# Patient Record
Sex: Female | Born: 1970 | Race: White | Hispanic: No | Marital: Single | Smoking: Former smoker
Health system: Southern US, Community
[De-identification: ages and names within clinical notes are randomized; demographics above are authoritative.]

## PROBLEM LIST (undated history)

## (undated) DIAGNOSIS — C50919 Malignant neoplasm of unspecified site of unspecified female breast: Secondary | ICD-10-CM

## (undated) DIAGNOSIS — T7840XA Allergy, unspecified, initial encounter: Secondary | ICD-10-CM

## (undated) DIAGNOSIS — E785 Hyperlipidemia, unspecified: Secondary | ICD-10-CM

## (undated) DIAGNOSIS — M8580 Other specified disorders of bone density and structure, unspecified site: Secondary | ICD-10-CM

## (undated) HISTORY — DX: Other specified disorders of bone density and structure, unspecified site: M85.80

## (undated) HISTORY — DX: Hyperlipidemia, unspecified: E78.5

## (undated) HISTORY — DX: Allergy, unspecified, initial encounter: T78.40XA

## (undated) HISTORY — PX: TUBAL LIGATION: SHX77

## (undated) HISTORY — DX: Malignant neoplasm of unspecified site of unspecified female breast: C50.919

---

## 2000-01-13 ENCOUNTER — Emergency Department (HOSPITAL_COMMUNITY): Admission: EM | Admit: 2000-01-13 | Discharge: 2000-01-13 | Payer: Self-pay | Admitting: *Deleted

## 2000-01-14 ENCOUNTER — Emergency Department (HOSPITAL_COMMUNITY): Admission: EM | Admit: 2000-01-14 | Discharge: 2000-01-14 | Payer: Self-pay | Admitting: Emergency Medicine

## 2000-01-15 ENCOUNTER — Inpatient Hospital Stay (HOSPITAL_COMMUNITY): Admission: EM | Admit: 2000-01-15 | Discharge: 2000-01-19 | Payer: Self-pay | Admitting: Emergency Medicine

## 2009-05-03 ENCOUNTER — Ambulatory Visit (HOSPITAL_COMMUNITY): Admission: RE | Admit: 2009-05-03 | Discharge: 2009-05-03 | Payer: Self-pay | Admitting: Family Medicine

## 2010-11-06 NOTE — Discharge Summary (Signed)
[facility]  Patient:    Kim Green                       MRN: [id number] Adm. Date:  [id number] Disc. Date: [id number] Attending:  Miguel Aschoff CC:         [facility]                           Discharge Summary  DATE OF BIRTH:  January 04, 1971.  CONSULTANTS:  None.  PROCEDURES:  None.  DISCHARGE DIAGNOSES: 1. Escherichia coli pyelonephritis with bacteremia, septicemia. 2. Intractable nausea, vomiting and dehydration secondary to #1. 3. Tobacco abuse, one pack per day x 10 years. 4. Microcytic anemia presumed secondary to menstrual loss, asymptomatic. 5. Probable depression, new diagnosis. 6. Bacterial vaginosis, diagnosed in the emergency room prior to this    admission.  DISCHARGE MEDICATIONS: 1. Septra DS one p.o. b.i.d. x 10 days. 2. Flagyl 500 mg p.o. b.i.d. x 7 days, as previously prescribed through    emergency room.  DISCHARGE FOLLOWUP:  Patient is scheduled for eligibility appointment at the [facility], January 27, 2000, at 11 a.m.  HISTORY:  Patient is a 40 year old woman who had experienced five days of dysuria, increased urinary frequency, followed by fevers, chills, nausea, vomiting and right flank pain.  She was seen in the emergency room, July 25th, with a fever of 102.  At that time, her pelvic exam was benign and GC and Chlamydia cultures were negative.  Urine pregnancy test was also noted to be negative.  Her urinalysis was significant for gross pyuria.  She was discharged after receiving a prescription for Septra, which she did not fill, and she returned the following day with increased nausea, vomiting and dehydration.  Medication to complete her course was provided by the emergency department but she was unable to keep it down and returned on date of admission very ill.  This represents her first episode of pyelonephritis and cystitis.  PHYSICAL EXAMINATION:  Temperature was 102.7, heart rate  160, respirations 32, blood pressure 106/62.  She had very dry mucous membranes and extremely poor skin turgor.  Right flank tenderness was noted on palpation.  LABORATORY DATA:  Urine culture obtained 24 hours previously revealed E. coli with sensitivity pending.  Hemoglobin was 11.2, MCV 75, WBC 15.7, platelets 177,000.  Sodium 131, potassium 3, chloride 101, CO2 23, BUN 12, creatinine 0.1, glucose 114.  LFTs were normal.  Kim Green was admitted with pyelonephritis and suspicion of bacteremia, for further care.  HOSPITAL COURSE: #1 - Kim Green was placed on IV Tequin, Compazine, Phenergan and Zofran (of which Zofran was the most effective), analgesia and IV fluids.  Within 24 hours, she was beginning to improve somewhat, although continued to have fevers.  On her second hospital day, she was defervescing and able to tolerate clear liquids and a subsequent advancement of her diet.  Her symptoms gradually resolved.  Two-of-two blood cultures were positive for E. coli, pansensitive.  Tequin was changed to oral, and she is discharged to complete a previous course of Septra for 14 total days of therapy.  We discussed means of preventing cystitis.  She is sexually active, although denies unprotected sex.  At the time of discharge, she is quite stable, ambulating without dizziness and consuming a normal diet.  Labs had normalized except for persistence of microcytic anemia noted in above labs.  #2 - DEPRESSION:  On several occasions, Kim Green was noted to be emotionally unstable, although she denied suicidal or homicidal ideations. She explained that she is under considerable stress and is depressed and she has never been evaluated for this.  I have advised her to notify her [facility] doctor who may refer her to county mental health, if appropriate. DD:  01/19/00 TD:  01/20/00 Job: [id number]

## 2010-11-06 NOTE — H&P (Signed)
[facility]  Patient:    Kim Green, Kim Green                      MRN: [id number] Adm. Date:  [id number] Attending:  Miguel Aschoff                         History and Physical  DATE OF BIRTH:  10/28/1970  PROBLEM LIST: 1. E. coli pyelonephritis with septicemia, rule out bacteremia. 2. Intractable nausea and vomiting associated with right flank pain secondary    to problem #1. 3. Severe dehydration. 4. Hypokalemia. 5. Smoking, one pack per day x 10 years. 6. Microcytic anemia. 7. Allergy to ROCEPHIN (rash).  CHIEF COMPLAINT:  Fever, chills, nausea, vomiting, right flank pain.  HISTORY OF PRESENT ILLNESS:  The patient is a 40 year old female who presents with a five-day history of dysuria, increased urinary frequency followed a few days later by fever, chills, nausea, vomiting, and right flank pain. The patient was seen on January 13, 2000, with a fever of 102. Her pelvic exam was benign. The GC and Chlamydia probes were negative. The urine pregnancy test was negative. The urinalysis was significant for gross pyuria. The patient was discharged home after receiving a prescription of Septra. The patient did not refill this prescription, and she returned the following day, January 14, 2000, to the emergency department at [facility]. During this second visit, the patient has increased signs of nausea and vomiting and dehydration. Septra was provided by the [facility] Emergency Department. The patient returned today again to the emergency department due to intractable nausea and vomiting, fever, chills, malaise.  The patient denies any previous history of pyelonephritis or UTIs. She denies unprotected sex. She denies IV drug abuse. She denies any previous history of nephrolithiasis. No melena, tarry stools, bright red blood per rectum, vaginal discharge. No diarrhea. As described above, the patient was unable to keep down solid or liquids for the last  two days. She denies jaw swelling, skin rash.  PAST MEDICAL HISTORY:  As problem list.  ALLERGIES:  As problem list.  CURRENT MEDICATIONS: 1. Septra DS one tablet b.i.d. (The patient did not take any of these pills    due to intractable vomiting.) 2. Compazine suppositories ______  mg q.6h. p.r.n.  SOCIAL HISTORY:  The patient is single. No children. Currently is unemployed. 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 She smokes as described in problem list. She does not drink. She does not use illegal drugs.  FAMILY MEDICAL HISTORY:  Negative for diabetes, hypertension, strokes, CAD, malignancy.  REVIEW OF SYSTEMS:  As HPI. No abdominal complaints. No rhinorrhea. No postnasal drip. No presyncopal symptoms. No seizures. No vertigo. No double vision. No focal weakness. No swallowing problems.  PHYSICAL EXAMINATION:  VITAL SIGNS:  Temperature 102.7, heart rate 160, respirations 32, blood pressure 106/62.  HEENT:  Normocephalic, atraumatic. Nonicteric sclerae. Conjunctivae within normal limits, except slightly pale. PERRLA. EOMI. Funduscopic exam negative for papilledema or hemorrhages. Very dry mucous membranes. Oropharynx clear. TMs within normal limits. The patient has a piercing in her tongue.  NECK:  Supple. No JVD. No bruits. No adenopathy. No thyromegaly.  LUNGS:  Clear to auscultation bilaterally without crackles, wheezes. Good air movement bilaterally.  CARDIAC:  Tachycardic. No S3. No rubs. Questionable 1/6 systolic ejection murmur at the left lower sternal border.  ABDOMEN:  Nontender, nondistended. Bowel sounds were present. There is a right flank  tenderness to palpation. No hepatosplenomegaly. No bruits. No masses. No rebound. No guarding.  RECTAL:  Deferred.  GENITOURINARY:  Within normal limits.  PELVIC:  Within normal limits.  EXTREMITIES:  No clubbing, cyanosis, or edema. Pulses 2+ bilaterally.  NEUROLOGICAL:  Alert and oriented x 3. Strength 5/5 in all  extremities. DTRs 3/5 in all extremities. Cranial nerves 2 through 12 intact. Sensory intact. Plantar reflexes downgoing bilaterally.  LABORATORY DATA:  Urine culture obtained 24 hours ago consistent with E. coli; sensitivity is pending. Hemoglobin 11.2, MCV 75, WBCs 15.7, platelets 177. Sodium 131, potassium 3, chloride 101, CO2 23, BUN 12, creatinine 1, glucose 114, albumin 3.3. LFTs within normal limits.  ASSESSMENT AND PLAN: 1. E. coli pyelonephritis with septicemia, rule out bacteremia. The patient    presents with clear signs of pyelonephritis. The urine culture is already    isolating E. coli. The sensitivities are pending. Given the patients    tachycardia, fever of 102.7, borderline hypotension, systemic signs of    infection, septicemia is a definitive diagnosis. Bacteremia needs to be    ruled out given the ongoing infectious process for about five days without    therapy. Blood cultures were obtained in the emergency department. The    patient is being admitted to the hospital to continue with intravenous    antibiotic therapy, pain control, and supportive care. We will be following    closely the sensitivities of the urine culture and the blood cultures that    currently are pending. A new CBC will be obtained in the morning to follow    on the patients leukocytosis. 2. Intractable nausea and vomiting. The symptoms seem to be related to problem    #1. For now, we will use supportive care with IV fluids, IV Pepcid, and IV    Phenergan. The main treatment plan also will be focused on the resolution    of the patients pyelonephritis which will help with these other    gastrointestinal symptoms. 3. Dehydration/hypokalemia. The physical exam is consistent with moderate to    severe degree of dehydration. Also noted is the hypokalemia associated with    this dehydration and decreased p.o. intake in the last few days. We will    start IV fluids wide open here in the emergency  department. The fluid    balance will be monitored. For now, we will use intravenous potassium     supplementation given the patient is having nausea and vomiting. Also, we    will follow the orthostatic blood pressure and daily weights throughout    this hospital stay. 4. Smoking. We spent about 10 minutes talking about smoking cessation. We will    go ahead and start a nicotine patch to help with withdrawal symptoms. 5. Microcytic anemia. The patient describes no symptoms of bleeding. I suspect    iron-deficiency anemia secondary to menstrual periods and acute disease.    This anemia will be worked up as an outpatient if no evidence of active    gastrointestinal bleed throughout this hospital stay. The patients    hemoglobin will be repeated tomorrow morning after hydration. For now, we    will use intravenous Pepcid for gastrointestinal protection. DD:  01/15/00 TD:  01/18/00 Job: [id number]

## 2011-12-22 ENCOUNTER — Ambulatory Visit (INDEPENDENT_AMBULATORY_CARE_PROVIDER_SITE_OTHER): Payer: BC Managed Care – PPO | Admitting: Family Medicine

## 2011-12-22 VITALS — BP 118/82 | HR 88 | Temp 98.0°F | Resp 18 | Ht 67.0 in | Wt 174.6 lb

## 2011-12-22 DIAGNOSIS — R51 Headache: Secondary | ICD-10-CM

## 2011-12-22 DIAGNOSIS — M542 Cervicalgia: Secondary | ICD-10-CM

## 2011-12-22 DIAGNOSIS — J301 Allergic rhinitis due to pollen: Secondary | ICD-10-CM

## 2011-12-22 DIAGNOSIS — J329 Chronic sinusitis, unspecified: Secondary | ICD-10-CM

## 2011-12-22 MED ORDER — CEFDINIR 300 MG PO CAPS: 300.0000 mg | ORAL_CAPSULE | Freq: Two times a day (BID) | ORAL | Status: AC

## 2011-12-22 NOTE — Patient Instructions (Signed)
Can take allergy med - allegra or claritin - 1 per day.  Ibuprofen or alleve as needed for the headache and neck pain.  If not improving in 4-5 days - recheck.  Sooner if worse.

## 2011-12-22 NOTE — Progress Notes (Signed)
  Subjective:    Patient ID: Kim Green, female    DOB: 1970-07-09, 41 y.o.   MRN: [id number]  HPI Kim Green is a 41 y.o. female Ha, sore in back of neck at times - on and off past 10 days.  Sinuses congested and pressure for past 10 days. No known fever.  Blowing nose more past 2 days - clear.  No photophobia, no phonophobia.  Dizziness - with certain head movements when in bed or bending forward.  Worse headache with leaning forward.   Hx of allergies in past but usually not this bad. No recent allergy meds.  Tx: dayquil x 1 last night.   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 , quit smoking in 2011.    Review of Systems Per hpi.     Objective:   Physical Exam  Constitutional: She is oriented to person, place, and time. She appears well-developed and well-nourished. No distress.  HENT:  Head: Normocephalic and atraumatic.  Right Ear: Hearing, tympanic membrane, external ear and ear canal normal.  Left Ear: Hearing, tympanic membrane, external ear and ear canal normal.  Nose: Nose normal. No mucosal edema. Right sinus exhibits no maxillary sinus tenderness and no frontal sinus tenderness. Left sinus exhibits no maxillary sinus tenderness and no frontal sinus tenderness.  Mouth/Throat: Oropharynx is clear and moist. No oropharyngeal exudate.  Eyes: Conjunctivae and EOM are normal. Pupils are equal, round, and reactive to light.  Neck: Normal range of motion. Neck supple.  Cardiovascular: Normal rate, regular rhythm, normal heart sounds and intact distal pulses.   No murmur heard. Pulmonary/Chest: Effort normal and breath sounds normal. No respiratory distress. She has no wheezes. She has no rhonchi.  Musculoskeletal: Normal range of motion.  Lymphadenopathy:    She has no cervical adenopathy.  Neurological: She is alert and oriented to person, place, and time.  Skin: Skin is warm and dry. No rash noted.  Psychiatric: She has a normal mood and affect. Her behavior is  normal.       Assessment & Plan:  Kim Green is a 41 y.o. female 1. Headache   2. Sinusitis   3. Neck pain    Likely combo of tension HA, allergies, and sinusitis - ddx sphenoid as nt to percussion over frontal/maxillary sinuses. discusssed sinus xray series - patient declined today, but recommended this or ct sinuses in next 5 days if not improving with alleve or ibuprofen, omnicef 300mg  BID, allegra or claritin and HEP/tx per neck manual. Understanding expressed.  Patient Instructions  Can take allergy med - allegra or claritin - 1 per day.  Ibuprofen or alleve as needed for the headache and neck pain.  If not improving in 4-5 days - recheck.  Sooner if worse.      Trial of neck care manual HEP,

## 2012-03-29 ENCOUNTER — Ambulatory Visit (INDEPENDENT_AMBULATORY_CARE_PROVIDER_SITE_OTHER): Payer: BC Managed Care – PPO | Admitting: Family Medicine

## 2012-03-29 VITALS — BP 110/76 | HR 94 | Temp 98.0°F | Resp 16 | Ht 65.5 in | Wt 182.0 lb

## 2012-03-29 DIAGNOSIS — N390 Urinary tract infection, site not specified: Secondary | ICD-10-CM

## 2012-03-29 DIAGNOSIS — R350 Frequency of micturition: Secondary | ICD-10-CM

## 2012-03-29 LAB — POCT URINALYSIS DIPSTICK
Bilirubin, UA: NEGATIVE
Glucose, UA: NEGATIVE
Ketones, UA: NEGATIVE
Nitrite, UA: NEGATIVE
Protein, UA: NEGATIVE
Spec Grav, UA: <=1.005
Urobilinogen, UA: 0.2
pH, UA: 6

## 2012-03-29 LAB — POCT UA - MICROSCOPIC ONLY
Casts, Ur, LPF, POC: NEGATIVE
Crystals, Ur, HPF, POC: NEGATIVE
Mucus, UA: POSITIVE
Yeast, UA: NEGATIVE

## 2012-03-29 MED ORDER — CEPHALEXIN 500 MG PO CAPS: 500.0000 mg | ORAL_CAPSULE | Freq: Two times a day (BID) | ORAL | Status: DC

## 2012-03-29 NOTE — Progress Notes (Signed)
[facility] [address] [place] [address] [phone]  Date:  03/29/2012   Name:  Kim Green   DOB:  1970/11/23   MRN:  [id number]  PCP:  No primary provider on file.    Chief Complaint: Urinary Tract Infection   History of Present Illness:  Kim Green is a 41 y.o. very pleasant female patient who presents with the following:  She is concerned that she may have a UTI.  She has noted urinary symptoms that have waxed and waned for the last few weeks.  She has tried to resolve the problem with hydration and cranberry juice, but she thinks she now needs some medicine.  She has also noted some back pain on and off.  No fever, no vomiting.   No vaginal symptoms.  No abdominal pain LMP was last week  She also notes a cold for the last 3 days- feels achy and tired.  She had some sneezing, and has PND.  Not coughing.  Chest is congested.  Otherwise she is generally healthy and takes no rx medications  There is no problem list on file for this patient.   No past medical history on file.  No past surgical history on file.  History  Substance Use Topics  . Smoking status: Former Games developer  . Smokeless tobacco: Not on file  . Alcohol Use: Not on file    No family history on file.  Allergies  Allergen Reactions  . Avelox (Moxifloxacin Hcl In Nacl)     Medication list has been reviewed and updated.  No current outpatient prescriptions on file prior to visit.    Review of Systems:  As per HPI- otherwise negative.   Physical Examination: Filed Vitals:   03/29/12 1530  BP: 110/76  Pulse: 94  Temp: 98 F (36.7 C)  Resp: 16   Filed Vitals:   03/29/12 1530  Height: 5' 5.5" (1.664 m)  Weight: 182 lb (82.555 kg)   Body mass index is 29.83 kg/(m^2). Ideal Body Weight: Weight in (lb) to have BMI = 25: 152.2   GEN: WDWN, NAD, Non-toxic, A & O x 3, overweight, looks well HEENT: Atraumatic, Normocephalic. Neck supple. No masses, No LAD.   Bilateral TM wnl, oropharynx normal.  PEERL,EOMI.   Ears and Nose: No external deformity. CV: RRR, No M/G/R. No JVD. No thrill. No extra heart sounds. PULM: CTA B, no wheezes, crackles, rhonchi. No retractions. No resp. distress. No accessory muscle use. ABD: S, NT, ND, +BS. No rebound. No HSM.  No CVA tenderness  EXTR: No c/c/e NEURO Normal gait.  PSYCH: Normally interactive. Conversant. Not depressed or anxious appearing.  Calm demeanor.   Results for orders placed in visit on 03/29/12  POCT UA - MICROSCOPIC ONLY      Component Value Range   WBC, Ur, HPF, POC 3-7     RBC, urine, microscopic 1-3     Bacteria, U Microscopic trace     Mucus, UA positive     Epithelial cells, urine per micros 0-1     Crystals, Ur, HPF, POC neg     Casts, Ur, LPF, POC neg     Yeast, UA neg    POCT URINALYSIS DIPSTICK      Component Value Range   Color, UA yellow     Clarity, UA clear     Glucose, UA neg     Bilirubin, UA neg     Ketones, UA neg  Spec Grav, UA <=1.005     Blood, UA mod     pH, UA 6.0     Protein, UA neg     Urobilinogen, UA 0.2     Nitrite, UA neg     Leukocytes, UA small (1+)      Assessment and Plan: 1. Urinary frequency  POCT UA - Microscopic Only, POCT urinalysis dipstick  2. UTI (lower urinary tract infection)  Urine culture, cephALEXin (KEFLEX) 500 MG capsule   Her urine is not overly impressive, but is is very dilute. Will go ahead and treat her based on her symptoms while we await her culture.  She has a high rx deductible, so will use keflex which is inexpensive.  Let me know if not better in the next few days- Sooner if worse.     Abbe Amsterdam, MD

## 2012-04-01 LAB — URINE CULTURE: Colony Count: >=100000

## 2012-06-21 DIAGNOSIS — Z0271 Encounter for disability determination: Secondary | ICD-10-CM

## 2012-08-19 DIAGNOSIS — Z0271 Encounter for disability determination: Secondary | ICD-10-CM

## 2012-09-04 ENCOUNTER — Emergency Department (HOSPITAL_COMMUNITY)
Admission: EM | Admit: 2012-09-04 | Discharge: 2012-09-04 | Disposition: A | Discharge status: Home / Self Care | Payer: No Typology Code available for payment source | Attending: Emergency Medicine | Admitting: Emergency Medicine

## 2012-09-04 ENCOUNTER — Encounter (HOSPITAL_COMMUNITY): Payer: Self-pay | Admitting: *Deleted

## 2012-09-04 ENCOUNTER — Emergency Department (HOSPITAL_COMMUNITY): Payer: No Typology Code available for payment source

## 2012-09-04 DIAGNOSIS — S139XXA Sprain of joints and ligaments of unspecified parts of neck, initial encounter: Secondary | ICD-10-CM | POA: Insufficient documentation

## 2012-09-04 DIAGNOSIS — S161XXA Strain of muscle, fascia and tendon at neck level, initial encounter: Secondary | ICD-10-CM

## 2012-09-04 DIAGNOSIS — S59909A Unspecified injury of unspecified elbow, initial encounter: Secondary | ICD-10-CM | POA: Insufficient documentation

## 2012-09-04 DIAGNOSIS — T148XXA Other injury of unspecified body region, initial encounter: Secondary | ICD-10-CM

## 2012-09-04 DIAGNOSIS — Y92410 Unspecified street and highway as the place of occurrence of the external cause: Secondary | ICD-10-CM | POA: Insufficient documentation

## 2012-09-04 DIAGNOSIS — Y9389 Activity, other specified: Secondary | ICD-10-CM | POA: Insufficient documentation

## 2012-09-04 DIAGNOSIS — S6990XA Unspecified injury of unspecified wrist, hand and finger(s), initial encounter: Secondary | ICD-10-CM | POA: Insufficient documentation

## 2012-09-04 DIAGNOSIS — IMO0002 Reserved for concepts with insufficient information to code with codable children: Secondary | ICD-10-CM | POA: Insufficient documentation

## 2012-09-04 DIAGNOSIS — Z87891 Personal history of nicotine dependence: Secondary | ICD-10-CM | POA: Insufficient documentation

## 2012-09-04 MED ORDER — IBUPROFEN 400 MG PO TABS
600.0000 mg | ORAL_TABLET | Freq: Once | ORAL | Status: AC
  Administered 2012-09-04: 600 mg via ORAL
  Filled 2012-09-04: qty 1

## 2012-09-04 MED ORDER — IBUPROFEN 600 MG PO TABS: 600.0000 mg | ORAL_TABLET | Freq: Three times a day (TID) | ORAL | Status: DC | PRN

## 2012-09-04 MED ORDER — HYDROCODONE-ACETAMINOPHEN 5-325 MG PO TABS: 2.0000 | ORAL_TABLET | Freq: Once | ORAL | Status: DC
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
## 2012-09-04 NOTE — ED Notes (Signed)
Dr Darrin Luis at bedside removed C collar
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
## 2012-09-04 NOTE — ED Notes (Signed)
Pt alert and mentating appropriately upon d/c teaching; pt given d/c teaching and prescriptions; pt educated on at home pain management and if/when to come back to ED; pt verbalizes understanding of d/c teaching and prescriptions; pt denies need for further questions; NAD noted upon d/c.

## 2012-09-04 NOTE — ED Notes (Signed)
Dr Darrin Luis notified of pts pulse; Dr Darrin Luis at bedside and verifies pt stable for d/c at this time

## 2012-09-04 NOTE — ED Notes (Signed)
Correction to care hand off; report not given to next shift RN; pt being d/c

## 2012-09-12 ENCOUNTER — Ambulatory Visit (INDEPENDENT_AMBULATORY_CARE_PROVIDER_SITE_OTHER): Payer: BC Managed Care – PPO | Admitting: Physician Assistant

## 2012-09-12 DIAGNOSIS — S161XXD Strain of muscle, fascia and tendon at neck level, subsequent encounter: Secondary | ICD-10-CM

## 2012-09-12 DIAGNOSIS — Z5189 Encounter for other specified aftercare: Secondary | ICD-10-CM

## 2012-09-12 DIAGNOSIS — S8991XD Unspecified injury of right lower leg, subsequent encounter: Secondary | ICD-10-CM

## 2012-09-12 DIAGNOSIS — S8992XD Unspecified injury of left lower leg, subsequent encounter: Secondary | ICD-10-CM

## 2012-09-12 MED ORDER — CYCLOBENZAPRINE HCL 10 MG PO TABS: 10.0000 mg | ORAL_TABLET | Freq: Three times a day (TID) | ORAL | Status: DC | PRN

## 2012-09-12 MED ORDER — MELOXICAM 15 MG PO TABS: 15.0000 mg | ORAL_TABLET | Freq: Every day | ORAL | Status: DC
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
## 2012-09-12 NOTE — Patient Instructions (Addendum)
Begin taking the Mobic (meloxicam) once daily for at least the next 7 days, this will help with pain and inflammation.  Do not take any additional ibuprofen or aleve with this.  If you need further pain relief, you can use tylenol.  Use the Flexeril (cyclobenzaprine) before bed.  Let's do this for 7 days as well.  If this does not make you sleepy, you can use it every 8 hours.    I will let you know when your labs come back and if we need to do anything further.  I think with time and patience this will resolve.     If you are worsening, or if in the next week you are seeing no improvement, please come back in   Contusion A contusion is a deep bruise. Contusions are the result of an injury that caused bleeding under the skin. The contusion may turn blue, purple, or yellow. Minor injuries will give you a painless contusion, but more severe contusions may stay painful and swollen for a few weeks.  CAUSES  A contusion is usually caused by a blow, trauma, or direct force to an area of the body. SYMPTOMS   Swelling and redness of the injured area.  Bruising of the injured area.  Tenderness and soreness of the injured area.  Pain. DIAGNOSIS  The diagnosis can be made by taking a history and physical exam. An X-ray, CT scan, or MRI may be needed to determine if there were any associated injuries, such as fractures. TREATMENT  Specific treatment will depend on what area of the body was injured. In general, the best treatment for a contusion is resting, icing, elevating, and applying cold compresses to the injured area. Over-the-counter medicines may also be recommended for pain control. Ask your caregiver what the best treatment is for your contusion. HOME CARE INSTRUCTIONS   Put ice on the injured area.  Put ice in a plastic bag.  Place a towel between your skin and the bag.  Leave the ice on for 15 to 20 minutes, 3 to 4 times a day.  Only take over-the-counter or prescription  medicines for pain, discomfort, or fever as directed by your caregiver. Your caregiver may recommend avoiding anti-inflammatory medicines (aspirin, ibuprofen, and naproxen) for 48 hours because these medicines may increase bruising.  Rest the injured area.  If possible, elevate the injured area to reduce swelling. SEEK IMMEDIATE MEDICAL CARE IF:   You have increased bruising or swelling.  You have pain that is getting worse.  Your swelling or pain is not relieved with medicines. MAKE SURE YOU:   Understand these instructions.  Will watch your condition.  Will get help right away if you are not doing well or get worse. Document Released: 03/17/2005 Document Revised: 08/30/2011 Document Reviewed: 04/12/2011 [facility] Patient Information 2013 [place], [place].

## 2012-09-13 LAB — BASIC METABOLIC PANEL
BUN: 10 mg/dL (ref 6–23)
Calcium: 9.6 mg/dL (ref 8.4–10.5)
Potassium: 4.4 mEq/L (ref 3.5–5.3)
Sodium: 140 mEq/L (ref 135–145)

## 2012-09-13 LAB — CK: Total CK: 367 U/L — ABNORMAL HIGH (ref 7–177)

## 2012-09-21 ENCOUNTER — Ambulatory Visit (INDEPENDENT_AMBULATORY_CARE_PROVIDER_SITE_OTHER): Payer: BC Managed Care – PPO | Admitting: Family Medicine

## 2012-09-21 VITALS — BP 122/94 | HR 100 | Temp 97.5°F | Resp 18 | Ht 65.0 in | Wt 180.0 lb

## 2012-09-21 DIAGNOSIS — R351 Nocturia: Secondary | ICD-10-CM

## 2012-09-21 DIAGNOSIS — N318 Other neuromuscular dysfunction of bladder: Secondary | ICD-10-CM

## 2012-09-21 LAB — POCT URINALYSIS DIPSTICK
Bilirubin, UA: NEGATIVE
Blood, UA: NEGATIVE
Glucose, UA: NEGATIVE
Leukocytes, UA: NEGATIVE
Nitrite, UA: NEGATIVE
Urobilinogen, UA: 0.2

## 2012-09-21 LAB — POCT UA - MICROSCOPIC ONLY
Casts, Ur, LPF, POC: NEGATIVE
Yeast, UA: NEGATIVE

## 2012-09-21 MED ORDER — CIPROFLOXACIN HCL 500 MG PO TABS: 500.0000 mg | ORAL_TABLET | Freq: Two times a day (BID) | ORAL | Status: DC

## 2012-09-21 NOTE — Patient Instructions (Addendum)
Urinary Tract Infection Urinary tract infections (UTIs) can develop anywhere along your urinary tract. Your urinary tract is your body's drainage system for removing wastes and extra water. Your urinary tract includes two kidneys, two ureters, a bladder, and a urethra. Your kidneys are a pair of bean-shaped organs. Each kidney is about the size of your fist. They are located below your ribs, one on each side of your spine. CAUSES Infections are caused by microbes, which are microscopic organisms, including fungi, viruses, and bacteria. These organisms are so small that they can only be seen through a microscope. Bacteria are the microbes that most commonly cause UTIs. SYMPTOMS  Symptoms of UTIs may vary by age and gender of the patient and by the location of the infection. Symptoms in young women typically include a frequent and intense urge to urinate and a painful, burning feeling in the bladder or urethra during urination. Older women and men are more likely to be tired, shaky, and weak and have muscle aches and abdominal pain. A fever may mean the infection is in your kidneys. Other symptoms of a kidney infection include pain in your back or sides below the ribs, nausea, and vomiting. DIAGNOSIS To diagnose a UTI, your caregiver will ask you about your symptoms. Your caregiver also will ask to provide a urine sample. The urine sample will be tested for bacteria and white blood cells. White blood cells are made by your body to help fight infection. TREATMENT  Typically, UTIs can be treated with medication. Because most UTIs are caused by a bacterial infection, they usually can be treated with the use of antibiotics. The choice of antibiotic and length of treatment depend on your symptoms and the type of bacteria causing your infection. HOME CARE INSTRUCTIONS  If you were prescribed antibiotics, take them exactly as your caregiver instructs you. Finish the medication even if you feel better after you  have only taken some of the medication.  Drink enough water and fluids to keep your urine clear or pale yellow.  Avoid caffeine, tea, and carbonated beverages. They tend to irritate your bladder.  Empty your bladder often. Avoid holding urine for long periods of time.  Empty your bladder before and after sexual intercourse.  After a bowel movement, women should cleanse from front to back. Use each tissue only once. SEEK MEDICAL CARE IF:   You have back pain.  You develop a fever.  Your symptoms do not begin to resolve within 3 days. SEEK IMMEDIATE MEDICAL CARE IF:   You have severe back pain or lower abdominal pain.  You develop chills.  You have nausea or vomiting.  You have continued burning or discomfort with urination. MAKE SURE YOU:   Understand these instructions.  Will watch your condition.  Will get help right away if you are not doing well or get worse. Document Released: 03/17/2005 Document Revised: 12/07/2011 Document Reviewed: 07/16/2011 [employer] Patient Information 2013 [employer].  
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
## 2012-09-24 LAB — URINE CULTURE: Colony Count: >=100000

## 2012-09-25 ENCOUNTER — Ambulatory Visit (INDEPENDENT_AMBULATORY_CARE_PROVIDER_SITE_OTHER): Payer: BC Managed Care – PPO | Admitting: Internal Medicine

## 2012-09-25 VITALS — BP 127/87 | HR 99 | Temp 97.9°F | Resp 16 | Ht 65.25 in | Wt 183.0 lb

## 2012-09-25 DIAGNOSIS — S8011XD Contusion of right lower leg, subsequent encounter: Secondary | ICD-10-CM

## 2012-09-25 DIAGNOSIS — Z5189 Encounter for other specified aftercare: Secondary | ICD-10-CM

## 2012-09-25 DIAGNOSIS — S8012XD Contusion of left lower leg, subsequent encounter: Secondary | ICD-10-CM

## 2012-09-25 DIAGNOSIS — N390 Urinary tract infection, site not specified: Secondary | ICD-10-CM

## 2012-09-25 DIAGNOSIS — Z6830 Body mass index (BMI) 30.0-30.9, adult: Secondary | ICD-10-CM

## 2012-09-25 MED ORDER — CIPROFLOXACIN HCL 500 MG PO TABS: 500.0000 mg | ORAL_TABLET | Freq: Two times a day (BID) | ORAL | Status: DC

## 2012-09-26 DIAGNOSIS — Z6830 Body mass index (BMI) 30.0-30.9, adult: Secondary | ICD-10-CM | POA: Insufficient documentation
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
## 2017-06-21 HISTORY — PX: MASTECTOMY: SHX3

## 2017-09-07 DIAGNOSIS — R232 Flushing: Secondary | ICD-10-CM | POA: Insufficient documentation

## 2017-09-21 ENCOUNTER — Other Ambulatory Visit: Payer: Self-pay

## 2017-09-21 ENCOUNTER — Other Ambulatory Visit: Payer: Self-pay | Admitting: Obstetrics & Gynecology

## 2017-09-21 DIAGNOSIS — R921 Mammographic calcification found on diagnostic imaging of breast: Secondary | ICD-10-CM

## 2017-09-21 DIAGNOSIS — R599 Enlarged lymph nodes, unspecified: Secondary | ICD-10-CM

## 2017-09-23 ENCOUNTER — Ambulatory Visit
Admission: RE | Admit: 2017-09-23 | Discharge: 2017-09-23 | Disposition: A | Discharge status: Home / Self Care | Payer: BLUE CROSS/BLUE SHIELD | Source: Ambulatory Visit

## 2017-09-23 ENCOUNTER — Other Ambulatory Visit: Payer: Self-pay | Admitting: Obstetrics & Gynecology

## 2017-09-23 ENCOUNTER — Ambulatory Visit
Admission: RE | Admit: 2017-09-23 | Discharge: 2017-09-23 | Disposition: A | Discharge status: Home / Self Care | Payer: BLUE CROSS/BLUE SHIELD | Source: Ambulatory Visit | Attending: Obstetrics & Gynecology | Admitting: Obstetrics & Gynecology

## 2017-09-23 DIAGNOSIS — R921 Mammographic calcification found on diagnostic imaging of breast: Secondary | ICD-10-CM

## 2017-09-23 DIAGNOSIS — R599 Enlarged lymph nodes, unspecified: Secondary | ICD-10-CM

## 2017-09-27 ENCOUNTER — Other Ambulatory Visit: Payer: Self-pay | Admitting: Obstetrics & Gynecology

## 2017-09-27 DIAGNOSIS — R921 Mammographic calcification found on diagnostic imaging of breast: Secondary | ICD-10-CM

## 2017-09-28 ENCOUNTER — Inpatient Hospital Stay: Admission: RE | Admit: 2017-09-28 | Payer: Self-pay | Source: Ambulatory Visit

## 2017-09-30 DIAGNOSIS — C50111 Malignant neoplasm of central portion of right female breast: Secondary | ICD-10-CM | POA: Insufficient documentation

## 2017-09-30 DIAGNOSIS — Z170 Estrogen receptor positive status [ER+]: Secondary | ICD-10-CM | POA: Insufficient documentation

## 2019-04-04 DIAGNOSIS — E782 Mixed hyperlipidemia: Secondary | ICD-10-CM | POA: Insufficient documentation

## 2019-10-01 ENCOUNTER — Ambulatory Visit: Payer: BC Managed Care – PPO | Admitting: Allergy and Immunology

## 2019-10-01 ENCOUNTER — Other Ambulatory Visit: Payer: Self-pay

## 2019-10-01 ENCOUNTER — Encounter: Payer: Self-pay | Admitting: Allergy and Immunology

## 2019-10-01 VITALS — BP 124/86 | HR 98 | Temp 98.4°F | Resp 18 | Ht 65.0 in | Wt 196.4 lb

## 2019-10-01 DIAGNOSIS — J3089 Other allergic rhinitis: Secondary | ICD-10-CM | POA: Diagnosis not present

## 2019-10-01 DIAGNOSIS — R0609 Other forms of dyspnea: Secondary | ICD-10-CM | POA: Insufficient documentation

## 2019-10-01 DIAGNOSIS — H1013 Acute atopic conjunctivitis, bilateral: Secondary | ICD-10-CM | POA: Diagnosis not present

## 2019-10-01 DIAGNOSIS — H1010 Acute atopic conjunctivitis, unspecified eye: Secondary | ICD-10-CM | POA: Insufficient documentation

## 2019-10-01 MED ORDER — CARBINOXAMINE MALEATE 4 MG PO TABS: ORAL_TABLET | ORAL | 0 refills | Status: DC

## 2019-10-01 MED ORDER — XHANCE 93 MCG/ACT NA EXHU: 2.0000 | INHALANT_SUSPENSION | Freq: Two times a day (BID) | NASAL | 5 refills | Status: DC

## 2019-10-01 MED ORDER — ALBUTEROL SULFATE HFA 108 (90 BASE) MCG/ACT IN AERS: 2.0000 | INHALATION_SPRAY | RESPIRATORY_TRACT | 1 refills | Status: AC | PRN

## 2019-10-01 MED ORDER — OLOPATADINE HCL 0.2 % OP SOLN: 1.0000 [drp] | Freq: Every day | OPHTHALMIC | 5 refills | Status: DC | PRN

## 2019-10-01 MED ORDER — EPINEPHRINE 0.3 MG/0.3ML IJ SOAJ: 0.3000 mg | Freq: Once | INTRAMUSCULAR | 1 refills | Status: AC

## 2019-10-01 NOTE — Progress Notes (Signed)
New Patient Note  RE: Kim Green MRN: [id number] DOB: 17-Dec-1970 Date of Office Visit: 10/01/2019  Referring provider: No ref. provider found Primary care provider: Osie Cheeks, PA-C  Chief Complaint: Allergic Rhinitis  (sinus problems) and Shortness of Breath   History of present illness: Kim Green is a 49 y.o. female presenting today for evaluation of rhinitis and shortness of breath.  She complains of postnasal drainage "always", as well as nasal congestion, rhinorrhea, sneezing, and ocular pruritus.  These symptoms occur year-round but seem to be more frequent and severe during the wintertime.  She also reports headaches when she is exposed to strong aromas and fragrances.  She has tried cetirizine, levocetirizine, and loratadine without perceived benefit.  She tried the combination of fexofenadine and fluticasone nasal spray with moderate benefit. Over the past 10 years, she has experienced occasional episodes of shortness of breath, typically described as being unable to "completely fill up" her lungs.  She reports that she has been "yawning all week long" which she believes may be due to pollen exposure.  She occasionally is able to achieve a satisfying on which provides temporary relief.  She does not and has never had a prescription for albuterol HFA.  She does report wheezing on rare occasions.  Assessment and plan: Perennial and seasonal allergic rhinitis  Aeroallergen avoidance measures have been discussed and provided in written form.  A prescription has been provided for [facility], 2 actuations per nostril twice a day. Proper technique has been discussed and demonstrated.  Nasal saline spray (i.e., Simply Saline) or nasal saline lavage (i.e., NeilMed) is recommended as needed and prior to medicated nasal sprays.  A prescription has been provided for carbinoxamine 4 mg every 6-8 hours if needed.  The risks and benefits of aeroallergen immunotherapy have been  discussed. The patient is interested in the possibility of initiating immunotherapy if insurance coverage is favorable. She will let us know how she would like to proceed.  Allergic conjunctivitis  Treatment plan as outlined above for allergic rhinitis.  A prescription has been provided for Pataday, one drop per eye daily as needed.  I have also recommended eye lubricant drops (i.e., Natural Tears) as needed.  Other forms of dyspnea The patients sensation of air-hunger, not being able to get a full breath on inspiration, which may eventually be relieved by a yawn suggests sighing dyspnea.  The patients spirometry today was normal.  Diaphragmatic breathing, or belly breathing, has been discussed with the patient as this technique often times relieves sighing dyspnea.  Given the history of rare wheezing, as a therapeutic trial, a prescription has been provided for albuterol HFA, 1 to 2 inhalations every 4-6 hours if needed.  The patient is to monitor for symptom relief, or lack thereof, from this medication.  The patients subjective and objective measures a pulmonary function will be followed and the treatment plan will be adjusted accordingly. We will recheck spirometry on the next visit.   Meds ordered this encounter  Medications  . Fluticasone Propionate (XHANCE) 93 MCG/ACT EXHU    Sig: Place 2 sprays into the nose in the morning and at bedtime.    Dispense:  32 mL    Refill:  5  . Carbinoxamine Maleate 4 MG TABS    Sig: Take 4mg  every 6-8 hours if needed    Dispense:  60 tablet    Refill:  0  . Olopatadine HCl 0.2 % SOLN    Sig: Apply 1 drop to eye  daily as needed.    Dispense:  2.5 mL    Refill:  5  . albuterol (VENTOLIN HFA) 108 (90 Base) MCG/ACT inhaler    Sig: Inhale 2 puffs into the lungs every 4 (four) hours as needed.    Dispense:  18 g    Refill:  1  . EPINEPHrine (AUVI-Q) 0.3 mg/0.3 mL IJ SOAJ injection    Sig: Inject 0.3 mLs (0.3 mg total) into the muscle once  for 1 dose. As directed for life-threatening allergic reactions    Dispense:  2 each    Refill:  1    [phone]    Diagnostics: Spirometry: Normal with an FEV1 of 98% predicted. This study was performed while the patient was asymptomatic.  Please see scanned spirometry results for details. Epicutaneous testing: Positive to molds, dust mite antigen, and dog epithelia. Intradermal testing: Positive to [place] grass, grass pollen mix, ragweed pollen mix, weed pollen mix, tree pollen mix, and cat hair.   Physical examination: Blood pressure 124/86, pulse 98, temperature 98.4 F (36.9 C), temperature source Temporal, resp. rate 18, height 5\' 5"  (1.651 m), weight 196 lb 6.4 oz (89.1 kg), SpO2 100 %.  General: Alert, interactive, in no acute distress. HEENT: TMs pearly gray, turbinates moderately edematous with clear discharge, post-pharynx moderately erythematous. Neck: Supple without lymphadenopathy. Lungs: Clear to auscultation without wheezing, rhonchi or rales. CV: Normal S1, S2 without murmurs. Abdomen: Nondistended, nontender. Skin: Warm and dry, without lesions or rashes. Extremities:  No clubbing, cyanosis or edema. Neuro:   Grossly intact.  Review of systems:  Review of systems negative except as noted in HPI / PMHx or noted below: Review of Systems  Constitutional: Negative.   HENT: Negative.   Eyes: Negative.   Respiratory: Negative.   Cardiovascular: Negative.   Gastrointestinal: Negative.   Genitourinary: Negative.   Musculoskeletal: Negative.   Skin: Negative.   Neurological: Negative.   Endo/Heme/Allergies: Negative.   Psychiatric/Behavioral: Negative.     Past medical history:  Past Medical History:  Diagnosis Date  . Allergy   . Breast cancer ([place])   . Hyperlipidemia     Past surgical history:  Past Surgical History:  Procedure Laterality Date  . MASTECTOMY  2019  . TUBAL LIGATION      Family history: Family History  Problem Relation Age of  Onset  . Diabetes Mother     Social history: Social History   Socioeconomic History  . Marital status: Single    Spouse name: Not on file  . Number of children: Not on file  . Years of education: Not on file  . Highest education level: Not on file  Occupational History  . Not on file  Tobacco Use  . Smoking status: Former Research scientist (life sciences)  . Smokeless tobacco: Never Used  Substance and Sexual Activity  . Alcohol use: Yes    Comment: rarely  . Drug use: No  . Sexual activity: Yes    Birth control/protection: Surgical  Other Topics Concern  . Not on file  Social History Narrative  . Not on file   Social Determinants of Health   Financial Resource Strain:   . Difficulty of Paying Living Expenses:   Food Insecurity:   . Worried About Charity fundraiser in the Last Year:   . Arboriculturist in the Last Year:   Transportation Needs:   . Film/video editor (Medical):   Marland Kitchen Lack of Transportation (Non-Medical):   Physical Activity:   . Days of  Exercise per Week:   . Minutes of Exercise per Session:   Stress:   . Feeling of Stress :   Social Connections:   . Frequency of Communication with Friends and Family:   . Frequency of Social Gatherings with Friends and Family:   . Attends Religious Services:   . Active Member of Clubs or Organizations:   . Attends Archivist Meetings:   Marland Kitchen Marital Status:   Intimate Partner Violence:   . Fear of Current or Ex-Partner:   . Emotionally Abused:   Marland Kitchen Physically Abused:   . Sexually Abused:     Environmental History: The patient lives in a 51 year old house with laminate floors throughout, gas heat and central air.  There is a dog that lives in the home which has access to her bedroom.  There is no known mold/water damage in the home.  She is a former cigarette smoker having smoked from 25 92-2011 with a 19-pack-year history.  Current Outpatient Medications  Medication Sig Dispense Refill  . ascorbic acid (VITAMIN C) 1000 MG  tablet Take by mouth.    Marland Kitchen atorvastatin (LIPITOR) 10 MG tablet Take 10 mg by mouth daily.    . B Complex-C-Folic Acid (RENA-VITE RX) 1 MG TABS Take by mouth.    . cetirizine (ZYRTEC) 10 MG tablet Take by mouth.    . Cholecalciferol (VITAMIN D3) 10 MCG (400 UNIT) tablet Take by mouth.    . fluticasone (FLONASE) 50 MCG/ACT nasal spray Place 1 spray into both nostrils daily.    Marland Kitchen letrozole (FEMARA) 2.5 MG tablet Take 2.5 mg by mouth daily.    Marland Kitchen venlafaxine XR (EFFEXOR-XR) 37.5 MG 24 hr capsule Take 37.5 mg by mouth daily.    . vitamin E 180 MG (400 UNITS) capsule Take by mouth.    Marland Kitchen albuterol (VENTOLIN HFA) 108 (90 Base) MCG/ACT inhaler Inhale 2 puffs into the lungs every 4 (four) hours as needed. 18 g 1  . Carbinoxamine Maleate 4 MG TABS Take 4mg  every 6-8 hours if needed 60 tablet 0  . EPINEPHrine (AUVI-Q) 0.3 mg/0.3 mL IJ SOAJ injection Inject 0.3 mLs (0.3 mg total) into the muscle once for 1 dose. As directed for life-threatening allergic reactions 2 each 1  . Fluticasone Propionate (XHANCE) 93 MCG/ACT EXHU Place 2 sprays into the nose in the morning and at bedtime. 32 mL 5  . Olopatadine HCl 0.2 % SOLN Apply 1 drop to eye daily as needed. 2.5 mL 5   No current facility-administered medications for this visit.    Known medication allergies: Allergies  Allergen Reactions  . Avelox [Moxifloxacin Hcl In Nacl] Shortness Of Breath    I appreciate the opportunity to take part in Loella's care. Please do not hesitate to contact me with questions.  Sincerely,   R. Edgar Frisk, MD

## 2019-10-01 NOTE — Patient Instructions (Addendum)
Perennial and seasonal allergic rhinitis  Aeroallergen avoidance measures have been discussed and provided in written form.  A prescription has been provided for [facility] 2 actuations per nostril twice a day. Proper technique has been discussed and demonstrated.  Nasal saline spray (i.e., Simply Saline) or nasal saline lavage (i.e., NeilMed) is recommended as needed and prior to medicated nasal sprays.  A prescription has been provided for carbinoxamine 4 mg every 6-8 hours if needed.  The risks and benefits of aeroallergen immunotherapy have been discussed. The patient is interested in the possibility of initiating immunotherapy if insurance coverage is favorable. She will let us know how she would like to proceed.  Allergic conjunctivitis  Treatment plan as outlined above for allergic rhinitis.  A prescription has been provided for Pataday, one drop per eye daily as needed.  I have also recommended eye lubricant drops (i.e., Natural Tears) as needed.  Other forms of dyspnea The patients sensation of air-hunger, not being able to get a full breath on inspiration, which may eventually be relieved by a yawn suggests sighing dyspnea.  The patients spirometry today was normal.  Diaphragmatic breathing, or belly breathing, has been discussed with the patient as this technique often times relieves sighing dyspnea.  Given the history of rare wheezing, as a therapeutic trial, a prescription has been provided for albuterol HFA, 1 to 2 inhalations every 4-6 hours if needed.  The patient is to monitor for symptom relief, or lack thereof, from this medication.  The patients subjective and objective measures a pulmonary function will be followed and the treatment plan will be adjusted accordingly. We will recheck spirometry on the next visit.   Return in about 3 months (around 12/31/2019), or if symptoms worsen or fail to improve.  Control of Dust Mite Allergen  House dust mites play a major  role in allergic asthma and rhinitis.  They occur in environments with high humidity wherever human skin, the food for dust mites is found. High levels have been detected in dust obtained from mattresses, pillows, carpets, upholstered furniture, bed covers, clothes and soft toys.  The principal allergen of the house dust mite is found in its feces.  A gram of dust may contain 1,000 mites and 250,000 fecal particles.  Mite antigen is easily measured in the air during house cleaning activities.    1. Encase mattresses, including the box spring, and pillow, in an air tight cover.  Seal the zipper end of the encased mattresses with wide adhesive tape. 2. Wash the bedding in water of 130 degrees Farenheit weekly.  Avoid cotton comforters/quilts and flannel bedding: the most ideal bed covering is the dacron comforter. 3. Remove all upholstered furniture from the bedroom. 4. Remove carpets, carpet padding, rugs, and non-washable window drapes from the bedroom.  Wash drapes weekly or use plastic window coverings. 5. Remove all non-washable stuffed toys from the bedroom.  Wash stuffed toys weekly. 6. Have the room cleaned frequently with a vacuum cleaner and a damp dust-mop.  The patient should not be in a room which is being cleaned and should wait 1 hour after cleaning before going into the room. 7. Close and seal all heating outlets in the bedroom.  Otherwise, the room will become filled with dust-laden air.  An electric heater can be used to heat the room. Reduce indoor humidity to less than 50%.  Do not use a humidifier.   Reducing Pollen Exposure  The American Academy of Allergy, Asthma and Immunology suggests the following  steps to reduce your exposure to pollen during allergy seasons.    1. Do not hang sheets or clothing out to dry; pollen may collect on these items. 2. Do not mow lawns or spend time around freshly cut grass; mowing stirs up pollen. 3. Keep windows closed at night.  Keep car  windows closed while driving. 4. Minimize morning activities outdoors, a time when pollen counts are usually at their highest. 5. Stay indoors as much as possible when pollen counts or humidity is high and on windy days when pollen tends to remain in the air longer. 6. Use air conditioning when possible.  Many air conditioners have filters that trap the pollen spores. 7. Use a HEPA room air filter to remove pollen form the indoor air you breathe.   Control of Dog or Cat Allergen  Avoidance is the best way to manage a dog or cat allergy. If you have a dog or cat and are allergic to dog or cats, consider removing the dog or cat from the home. If you have a dog or cat but don't want to find it a new home, or if your family wants a pet even though someone in the household is allergic, here are some strategies that may help keep symptoms at bay:  1. Keep the pet out of your bedroom and restrict it to only a few rooms. Be advised that keeping the dog or cat in only one room will not limit the allergens to that room. 2. Don't pet, hug or kiss the dog or cat; if you do, wash your hands with soap and water. 3. High-efficiency particulate air (HEPA) cleaners run continuously in a bedroom or living room can reduce allergen levels over time. 4. Place electrostatic material sheet in the air inlet vent in the bedroom. 5. Regular use of a high-efficiency vacuum cleaner or a central vacuum can reduce allergen levels. 6. Giving your dog or cat a bath at least once a week can reduce airborne allergen.   Control of Mold Allergen  Mold and fungi can grow on a variety of surfaces provided certain temperature and moisture conditions exist.  Outdoor molds grow on plants, decaying vegetation and soil.  The major outdoor mold, Alternaria and Cladosporium, are found in very high numbers during hot and dry conditions.  Generally, a late Summer - Fall peak is seen for common outdoor fungal spores.  Rain will temporarily  lower outdoor mold spore count, but counts rise rapidly when the rainy period ends.  The most important indoor molds are Aspergillus and Penicillium.  Dark, humid and poorly ventilated basements are ideal sites for mold growth.  The next most common sites of mold growth are the bathroom and the kitchen.  Outdoor [employer] 1. Use air conditioning and keep windows closed 2. Avoid exposure to decaying vegetation. 3. Avoid leaf raking. 4. Avoid grain handling. 5. Consider wearing a face mask if working in moldy areas.  Indoor Mold Control 1. Maintain humidity below 50%. 2. Clean washable surfaces with 5% bleach solution. 3. Remove sources e.g. Contaminated carpets.

## 2019-10-01 NOTE — Assessment & Plan Note (Addendum)
   Aeroallergen avoidance measures have been discussed and provided in written form.  A prescription has been provided for [facility], 2 actuations per nostril twice a day. Proper technique has been discussed and demonstrated.  Nasal saline spray (i.e., Simply Saline) or nasal saline lavage (i.e., NeilMed) is recommended as needed and prior to medicated nasal sprays.  A prescription has been provided for carbinoxamine 4 mg every 6-8 hours if needed.  The risks and benefits of aeroallergen immunotherapy have been discussed. The patient is interested in the possibility of initiating immunotherapy if insurance coverage is favorable. She will let us know how she would like to proceed.

## 2019-10-01 NOTE — Assessment & Plan Note (Signed)
   Treatment plan as outlined above for allergic rhinitis.  A prescription has been provided for Pataday, one drop per eye daily as needed.  I have also recommended eye lubricant drops (i.e., Natural Tears) as needed. 

## 2019-10-01 NOTE — Assessment & Plan Note (Signed)
The patients sensation of air-hunger, not being able to get a full breath on inspiration, which may eventually be relieved by a yawn suggests sighing dyspnea.  The patients spirometry today was normal.  Diaphragmatic breathing, or belly breathing, has been discussed with the patient as this technique often times relieves sighing dyspnea.  Given the history of rare wheezing, as a therapeutic trial, a prescription has been provided for albuterol HFA, 1 to 2 inhalations every 4-6 hours if needed.  The patient is to monitor for symptom relief, or lack thereof, from this medication.  The patients subjective and objective measures a pulmonary function will be followed and the treatment plan will be adjusted accordingly. We will recheck spirometry on the next visit.

## 2019-10-02 DIAGNOSIS — J3089 Other allergic rhinitis: Secondary | ICD-10-CM | POA: Diagnosis not present

## 2019-10-02 NOTE — Progress Notes (Signed)
EXP 10/01/20

## 2019-10-03 DIAGNOSIS — J3081 Allergic rhinitis due to animal (cat) (dog) hair and dander: Secondary | ICD-10-CM | POA: Diagnosis not present

## 2019-10-08 ENCOUNTER — Other Ambulatory Visit: Payer: Self-pay | Admitting: Allergy and Immunology

## 2019-10-23 ENCOUNTER — Ambulatory Visit (INDEPENDENT_AMBULATORY_CARE_PROVIDER_SITE_OTHER): Payer: BC Managed Care – PPO

## 2019-10-23 ENCOUNTER — Other Ambulatory Visit: Payer: Self-pay

## 2019-10-23 DIAGNOSIS — J309 Allergic rhinitis, unspecified: Secondary | ICD-10-CM

## 2019-10-30 ENCOUNTER — Ambulatory Visit (INDEPENDENT_AMBULATORY_CARE_PROVIDER_SITE_OTHER): Payer: BC Managed Care – PPO

## 2019-10-30 DIAGNOSIS — J309 Allergic rhinitis, unspecified: Secondary | ICD-10-CM | POA: Diagnosis not present

## 2019-11-08 ENCOUNTER — Ambulatory Visit (INDEPENDENT_AMBULATORY_CARE_PROVIDER_SITE_OTHER): Payer: BC Managed Care – PPO

## 2019-11-08 DIAGNOSIS — J309 Allergic rhinitis, unspecified: Secondary | ICD-10-CM

## 2019-11-22 ENCOUNTER — Ambulatory Visit (INDEPENDENT_AMBULATORY_CARE_PROVIDER_SITE_OTHER): Payer: BC Managed Care – PPO

## 2019-11-22 DIAGNOSIS — J309 Allergic rhinitis, unspecified: Secondary | ICD-10-CM

## 2019-11-30 ENCOUNTER — Ambulatory Visit (INDEPENDENT_AMBULATORY_CARE_PROVIDER_SITE_OTHER): Payer: BC Managed Care – PPO

## 2019-11-30 DIAGNOSIS — J309 Allergic rhinitis, unspecified: Secondary | ICD-10-CM | POA: Diagnosis not present

## 2019-12-04 ENCOUNTER — Ambulatory Visit (INDEPENDENT_AMBULATORY_CARE_PROVIDER_SITE_OTHER): Payer: BC Managed Care – PPO

## 2019-12-04 DIAGNOSIS — J309 Allergic rhinitis, unspecified: Secondary | ICD-10-CM | POA: Diagnosis not present

## 2019-12-11 ENCOUNTER — Ambulatory Visit (INDEPENDENT_AMBULATORY_CARE_PROVIDER_SITE_OTHER): Payer: BC Managed Care – PPO

## 2019-12-11 DIAGNOSIS — J309 Allergic rhinitis, unspecified: Secondary | ICD-10-CM | POA: Diagnosis not present

## 2019-12-18 ENCOUNTER — Ambulatory Visit (INDEPENDENT_AMBULATORY_CARE_PROVIDER_SITE_OTHER): Payer: BC Managed Care – PPO

## 2019-12-18 DIAGNOSIS — J309 Allergic rhinitis, unspecified: Secondary | ICD-10-CM | POA: Diagnosis not present

## 2019-12-25 ENCOUNTER — Ambulatory Visit (INDEPENDENT_AMBULATORY_CARE_PROVIDER_SITE_OTHER): Payer: BC Managed Care – PPO

## 2019-12-25 DIAGNOSIS — J309 Allergic rhinitis, unspecified: Secondary | ICD-10-CM

## 2019-12-31 ENCOUNTER — Ambulatory Visit: Payer: BC Managed Care – PPO | Admitting: Allergy and Immunology

## 2019-12-31 ENCOUNTER — Other Ambulatory Visit: Payer: Self-pay

## 2019-12-31 ENCOUNTER — Encounter: Payer: Self-pay | Admitting: Allergy and Immunology

## 2019-12-31 VITALS — BP 128/82 | HR 88 | Temp 98.2°F | Resp 16

## 2019-12-31 DIAGNOSIS — J3089 Other allergic rhinitis: Secondary | ICD-10-CM | POA: Diagnosis not present

## 2019-12-31 DIAGNOSIS — R0609 Other forms of dyspnea: Secondary | ICD-10-CM | POA: Diagnosis not present

## 2019-12-31 DIAGNOSIS — H1013 Acute atopic conjunctivitis, bilateral: Secondary | ICD-10-CM | POA: Diagnosis not present

## 2019-12-31 NOTE — Assessment & Plan Note (Signed)
Quiescent.  If this problem recurs, restart diaphragmatic breathing as directed.

## 2019-12-31 NOTE — Assessment & Plan Note (Addendum)
Well-controlled.   Continue appropriate aeroallergen avoidance measures.  Continue Xhance, 2 actuations per nostril twice a day as needed.   Nasal saline spray (i.e., Simply Saline) or nasal saline lavage (i.e., NeilMed) is recommended as needed and prior to medicated nasal sprays.  For thick post nasal drainage, add guaifenesin 1200 mg (Mucinex Maximum Strength)  twice daily as needed with adequate hydration as discussed.  If allergen avoidance measures and medications fail to adequately relieve symptoms, aeroallergen immunotherapy will be considered.

## 2019-12-31 NOTE — Assessment & Plan Note (Signed)
   Treatment plan as outlined above for allergic rhinitis.  Pataday, one drop per eye daily if needed.

## 2019-12-31 NOTE — Patient Instructions (Signed)
Perennial and seasonal allergic rhinitis Well-controlled.   Continue appropriate aeroallergen avoidance measures.  Continue Xhance, 2 actuations per nostril twice a day as needed.   Nasal saline spray (i.e., Simply Saline) or nasal saline lavage (i.e., NeilMed) is recommended as needed and prior to medicated nasal sprays.  For thick post nasal drainage, add guaifenesin 1200 mg (Mucinex Maximum Strength)  twice daily as needed with adequate hydration as discussed.  If allergen avoidance measures and medications fail to adequately relieve symptoms, aeroallergen immunotherapy will be considered.  Allergic conjunctivitis  Treatment plan as outlined above for allergic rhinitis.  Pataday, one drop per eye daily if needed.  Other forms of dyspnea Quiescent.  If this problem recurs, restart diaphragmatic breathing as directed.   Return in about 1 year (around 12/30/2020), or if symptoms worsen or fail to improve.

## 2019-12-31 NOTE — Progress Notes (Signed)
Follow-up Note  RE: Kim Green MRN: [id number] DOB: June 03, 1971 Date of Office Visit: 12/31/2019  Primary care provider: Osie Cheeks, PA-C Referring provider: Osie Cheeks, PA*  History of present illness: Kim Green is a 49 y.o. female with allergic rhinoconjunctivitis and history of sighing dyspnea presenting today for follow-up. She was previously seen in this clinic on October 01, 2019 for her initial evaluation.  She reports that her nasal and ocular allergy symptoms have improved significantly since her previous visit.  She states that "Truett Perna is amazing."  She reports that she tried the diaphragmatic breathing exercises and has not had a problem with sighing dyspnea since that time.  She has no new problems or complaints today.  Assessment and plan: Perennial and seasonal allergic rhinitis Well-controlled.   Continue appropriate aeroallergen avoidance measures.  Continue Xhance, 2 actuations per nostril twice a day as needed.   Nasal saline spray (i.e., Simply Saline) or nasal saline lavage (i.e., NeilMed) is recommended as needed and prior to medicated nasal sprays.  For thick post nasal drainage, add guaifenesin 1200 mg (Mucinex Maximum Strength)  twice daily as needed with adequate hydration as discussed.  If allergen avoidance measures and medications fail to adequately relieve symptoms, aeroallergen immunotherapy will be considered.  Allergic conjunctivitis  Treatment plan as outlined above for allergic rhinitis.  Pataday, one drop per eye daily if needed.  Other forms of dyspnea Quiescent.  If this problem recurs, restart diaphragmatic breathing as directed.   Diagnostics: Spirometry:  Normal with an FEV1 of 102% predicted. This study was performed while the patient was asymptomatic.  Please see scanned spirometry results for details.    Physical examination: Blood pressure 128/82, pulse 88, temperature 98.2 F (36.8 C), temperature  source Oral, resp. rate 16, SpO2 97 %.  General: Alert, interactive, in no acute distress. HEENT: TMs pearly gray, turbinates minimally edematous without discharge, post-pharynx mildly erythematous. Neck: Supple without lymphadenopathy. Lungs: Clear to auscultation without wheezing, rhonchi or rales. CV: Normal S1, S2 without murmurs. Skin: Warm and dry, without lesions or rashes.  The following portions of the patient's history were reviewed and updated as appropriate: allergies, current medications, past family history, past medical history, past social history, past surgical history and problem list.  Current Outpatient Medications  Medication Sig Dispense Refill  . albuterol (VENTOLIN HFA) 108 (90 Base) MCG/ACT inhaler Inhale 2 puffs into the lungs every 4 (four) hours as needed. 18 g 1  . ascorbic acid (VITAMIN C) 1000 MG tablet Take by mouth.    Marland Kitchen atorvastatin (LIPITOR) 10 MG tablet Take 10 mg by mouth daily.    . Calcium Carbonate (CALTRATE 600 PO) Take by mouth.    . cetirizine (ZYRTEC) 10 MG tablet Take by mouth.    . Fluticasone Propionate (XHANCE) 93 MCG/ACT EXHU Place 2 sprays into the nose in the morning and at bedtime. 32 mL 5  . letrozole (FEMARA) 2.5 MG tablet Take 2.5 mg by mouth daily.    . Omega-3 Fatty Acids (FISH OIL) 500 MG CAPS Take 3,000 mg by mouth.    . venlafaxine XR (EFFEXOR-XR) 37.5 MG 24 hr capsule Take 37.5 mg by mouth daily.    . vitamin E 180 MG (400 UNITS) capsule Take by mouth.    . vitamin k 100 MCG tablet Take 100 mcg by mouth daily.    . B Complex-C-Folic Acid (RENA-VITE RX) 1 MG TABS Take by mouth.    . Cholecalciferol (VITAMIN D3) 10 MCG (400  UNIT) tablet Take by mouth.    . fluticasone (FLONASE) 50 MCG/ACT nasal spray Place 1 spray into both nostrils daily. (Patient not taking: Reported on 12/31/2019)     No current facility-administered medications for this visit.    Allergies  Allergen Reactions  . Avelox [Moxifloxacin Hcl In Nacl] Shortness  Of Breath    I appreciate the opportunity to take part in Glenys's care. Please do not hesitate to contact me with questions.  Sincerely,   R. Edgar Frisk, MD

## 2020-01-01 ENCOUNTER — Ambulatory Visit (INDEPENDENT_AMBULATORY_CARE_PROVIDER_SITE_OTHER): Payer: BC Managed Care – PPO

## 2020-01-01 DIAGNOSIS — J3089 Other allergic rhinitis: Secondary | ICD-10-CM

## 2020-01-01 DIAGNOSIS — J309 Allergic rhinitis, unspecified: Secondary | ICD-10-CM

## 2020-01-10 ENCOUNTER — Ambulatory Visit (INDEPENDENT_AMBULATORY_CARE_PROVIDER_SITE_OTHER): Payer: BC Managed Care – PPO

## 2020-01-10 DIAGNOSIS — J309 Allergic rhinitis, unspecified: Secondary | ICD-10-CM | POA: Diagnosis not present

## 2020-01-10 DIAGNOSIS — J3089 Other allergic rhinitis: Secondary | ICD-10-CM

## 2020-01-17 ENCOUNTER — Ambulatory Visit (INDEPENDENT_AMBULATORY_CARE_PROVIDER_SITE_OTHER): Payer: BC Managed Care – PPO

## 2020-01-17 DIAGNOSIS — J309 Allergic rhinitis, unspecified: Secondary | ICD-10-CM | POA: Diagnosis not present

## 2020-01-17 DIAGNOSIS — J3089 Other allergic rhinitis: Secondary | ICD-10-CM

## 2020-01-22 ENCOUNTER — Ambulatory Visit (INDEPENDENT_AMBULATORY_CARE_PROVIDER_SITE_OTHER): Payer: BC Managed Care – PPO

## 2020-01-22 DIAGNOSIS — J3089 Other allergic rhinitis: Secondary | ICD-10-CM

## 2020-01-22 DIAGNOSIS — J309 Allergic rhinitis, unspecified: Secondary | ICD-10-CM | POA: Diagnosis not present

## 2020-01-29 ENCOUNTER — Ambulatory Visit (INDEPENDENT_AMBULATORY_CARE_PROVIDER_SITE_OTHER): Payer: BC Managed Care – PPO

## 2020-01-29 DIAGNOSIS — J3089 Other allergic rhinitis: Secondary | ICD-10-CM

## 2020-01-29 DIAGNOSIS — J309 Allergic rhinitis, unspecified: Secondary | ICD-10-CM

## 2020-02-07 ENCOUNTER — Ambulatory Visit (INDEPENDENT_AMBULATORY_CARE_PROVIDER_SITE_OTHER): Payer: BC Managed Care – PPO

## 2020-02-07 DIAGNOSIS — J309 Allergic rhinitis, unspecified: Secondary | ICD-10-CM

## 2020-02-07 DIAGNOSIS — J3089 Other allergic rhinitis: Secondary | ICD-10-CM

## 2020-02-21 ENCOUNTER — Ambulatory Visit (INDEPENDENT_AMBULATORY_CARE_PROVIDER_SITE_OTHER): Payer: BC Managed Care – PPO

## 2020-02-21 DIAGNOSIS — J309 Allergic rhinitis, unspecified: Secondary | ICD-10-CM | POA: Diagnosis not present

## 2020-02-29 ENCOUNTER — Ambulatory Visit (INDEPENDENT_AMBULATORY_CARE_PROVIDER_SITE_OTHER): Payer: BC Managed Care – PPO

## 2020-02-29 DIAGNOSIS — J309 Allergic rhinitis, unspecified: Secondary | ICD-10-CM | POA: Diagnosis not present

## 2020-03-06 ENCOUNTER — Ambulatory Visit (INDEPENDENT_AMBULATORY_CARE_PROVIDER_SITE_OTHER): Payer: BC Managed Care – PPO | Admitting: *Deleted

## 2020-03-06 DIAGNOSIS — J309 Allergic rhinitis, unspecified: Secondary | ICD-10-CM

## 2020-03-07 ENCOUNTER — Other Ambulatory Visit: Payer: Self-pay

## 2020-03-07 MED ORDER — XHANCE 93 MCG/ACT NA EXHU: 2.0000 | INHALANT_SUSPENSION | Freq: Two times a day (BID) | NASAL | 5 refills | Status: DC

## 2020-03-13 ENCOUNTER — Ambulatory Visit (INDEPENDENT_AMBULATORY_CARE_PROVIDER_SITE_OTHER): Payer: BC Managed Care – PPO

## 2020-03-13 DIAGNOSIS — J309 Allergic rhinitis, unspecified: Secondary | ICD-10-CM

## 2020-03-25 ENCOUNTER — Ambulatory Visit (INDEPENDENT_AMBULATORY_CARE_PROVIDER_SITE_OTHER): Payer: BC Managed Care – PPO | Admitting: *Deleted

## 2020-03-25 DIAGNOSIS — J309 Allergic rhinitis, unspecified: Secondary | ICD-10-CM

## 2020-04-03 ENCOUNTER — Ambulatory Visit (INDEPENDENT_AMBULATORY_CARE_PROVIDER_SITE_OTHER): Payer: BC Managed Care – PPO

## 2020-04-03 DIAGNOSIS — J309 Allergic rhinitis, unspecified: Secondary | ICD-10-CM

## 2020-04-08 ENCOUNTER — Ambulatory Visit (INDEPENDENT_AMBULATORY_CARE_PROVIDER_SITE_OTHER): Payer: BC Managed Care – PPO

## 2020-04-08 DIAGNOSIS — J309 Allergic rhinitis, unspecified: Secondary | ICD-10-CM

## 2020-04-15 ENCOUNTER — Ambulatory Visit (INDEPENDENT_AMBULATORY_CARE_PROVIDER_SITE_OTHER): Payer: BC Managed Care – PPO

## 2020-04-15 DIAGNOSIS — J309 Allergic rhinitis, unspecified: Secondary | ICD-10-CM

## 2020-04-24 ENCOUNTER — Ambulatory Visit (INDEPENDENT_AMBULATORY_CARE_PROVIDER_SITE_OTHER): Payer: BC Managed Care – PPO

## 2020-04-24 DIAGNOSIS — J309 Allergic rhinitis, unspecified: Secondary | ICD-10-CM | POA: Diagnosis not present

## 2020-05-01 ENCOUNTER — Ambulatory Visit (INDEPENDENT_AMBULATORY_CARE_PROVIDER_SITE_OTHER): Payer: BC Managed Care – PPO

## 2020-05-01 DIAGNOSIS — J309 Allergic rhinitis, unspecified: Secondary | ICD-10-CM

## 2020-05-14 ENCOUNTER — Ambulatory Visit (INDEPENDENT_AMBULATORY_CARE_PROVIDER_SITE_OTHER): Payer: BC Managed Care – PPO | Admitting: *Deleted

## 2020-05-14 DIAGNOSIS — J309 Allergic rhinitis, unspecified: Secondary | ICD-10-CM | POA: Diagnosis not present

## 2020-05-22 ENCOUNTER — Ambulatory Visit (INDEPENDENT_AMBULATORY_CARE_PROVIDER_SITE_OTHER): Payer: BC Managed Care – PPO

## 2020-05-22 DIAGNOSIS — J309 Allergic rhinitis, unspecified: Secondary | ICD-10-CM

## 2020-05-27 ENCOUNTER — Ambulatory Visit (INDEPENDENT_AMBULATORY_CARE_PROVIDER_SITE_OTHER): Payer: BC Managed Care – PPO

## 2020-05-27 DIAGNOSIS — J309 Allergic rhinitis, unspecified: Secondary | ICD-10-CM | POA: Diagnosis not present

## 2020-06-05 ENCOUNTER — Ambulatory Visit (INDEPENDENT_AMBULATORY_CARE_PROVIDER_SITE_OTHER): Payer: BC Managed Care – PPO

## 2020-06-05 DIAGNOSIS — J309 Allergic rhinitis, unspecified: Secondary | ICD-10-CM

## 2020-06-10 ENCOUNTER — Ambulatory Visit (INDEPENDENT_AMBULATORY_CARE_PROVIDER_SITE_OTHER): Payer: BC Managed Care – PPO | Admitting: *Deleted

## 2020-06-10 DIAGNOSIS — J309 Allergic rhinitis, unspecified: Secondary | ICD-10-CM | POA: Diagnosis not present

## 2020-06-17 ENCOUNTER — Ambulatory Visit (INDEPENDENT_AMBULATORY_CARE_PROVIDER_SITE_OTHER): Payer: BC Managed Care – PPO | Admitting: *Deleted

## 2020-06-17 DIAGNOSIS — J309 Allergic rhinitis, unspecified: Secondary | ICD-10-CM | POA: Diagnosis not present

## 2020-07-03 ENCOUNTER — Ambulatory Visit (INDEPENDENT_AMBULATORY_CARE_PROVIDER_SITE_OTHER): Payer: BC Managed Care – PPO | Admitting: *Deleted

## 2020-07-03 DIAGNOSIS — J309 Allergic rhinitis, unspecified: Secondary | ICD-10-CM | POA: Diagnosis not present

## 2020-07-10 ENCOUNTER — Ambulatory Visit (INDEPENDENT_AMBULATORY_CARE_PROVIDER_SITE_OTHER): Payer: BC Managed Care – PPO

## 2020-07-10 DIAGNOSIS — J309 Allergic rhinitis, unspecified: Secondary | ICD-10-CM | POA: Diagnosis not present

## 2020-07-17 ENCOUNTER — Ambulatory Visit (INDEPENDENT_AMBULATORY_CARE_PROVIDER_SITE_OTHER): Payer: BC Managed Care – PPO

## 2020-07-17 DIAGNOSIS — J309 Allergic rhinitis, unspecified: Secondary | ICD-10-CM | POA: Diagnosis not present

## 2020-08-12 ENCOUNTER — Ambulatory Visit (INDEPENDENT_AMBULATORY_CARE_PROVIDER_SITE_OTHER): Payer: BC Managed Care – PPO | Admitting: *Deleted

## 2020-08-12 DIAGNOSIS — J309 Allergic rhinitis, unspecified: Secondary | ICD-10-CM

## 2020-08-13 ENCOUNTER — Other Ambulatory Visit: Payer: Self-pay

## 2020-08-13 MED ORDER — XHANCE 93 MCG/ACT NA EXHU: 2.0000 | INHALANT_SUSPENSION | Freq: Two times a day (BID) | NASAL | 4 refills | Status: AC

## 2020-08-19 ENCOUNTER — Ambulatory Visit (INDEPENDENT_AMBULATORY_CARE_PROVIDER_SITE_OTHER): Payer: BC Managed Care – PPO

## 2020-08-19 DIAGNOSIS — J309 Allergic rhinitis, unspecified: Secondary | ICD-10-CM

## 2020-08-19 NOTE — Progress Notes (Signed)
VIALS EXP 08-19-21 

## 2020-08-20 DIAGNOSIS — J3089 Other allergic rhinitis: Secondary | ICD-10-CM | POA: Diagnosis not present

## 2020-08-26 ENCOUNTER — Ambulatory Visit (INDEPENDENT_AMBULATORY_CARE_PROVIDER_SITE_OTHER): Payer: BC Managed Care – PPO | Admitting: *Deleted

## 2020-08-26 DIAGNOSIS — J309 Allergic rhinitis, unspecified: Secondary | ICD-10-CM

## 2020-09-04 ENCOUNTER — Ambulatory Visit (INDEPENDENT_AMBULATORY_CARE_PROVIDER_SITE_OTHER): Payer: BC Managed Care – PPO | Admitting: *Deleted

## 2020-09-04 DIAGNOSIS — J309 Allergic rhinitis, unspecified: Secondary | ICD-10-CM

## 2020-09-09 ENCOUNTER — Ambulatory Visit (INDEPENDENT_AMBULATORY_CARE_PROVIDER_SITE_OTHER): Payer: BC Managed Care – PPO | Admitting: *Deleted

## 2020-09-09 DIAGNOSIS — J309 Allergic rhinitis, unspecified: Secondary | ICD-10-CM | POA: Diagnosis not present

## 2020-09-16 ENCOUNTER — Ambulatory Visit (INDEPENDENT_AMBULATORY_CARE_PROVIDER_SITE_OTHER): Payer: BC Managed Care – PPO | Admitting: *Deleted

## 2020-09-16 DIAGNOSIS — J309 Allergic rhinitis, unspecified: Secondary | ICD-10-CM

## 2020-09-23 ENCOUNTER — Ambulatory Visit (INDEPENDENT_AMBULATORY_CARE_PROVIDER_SITE_OTHER): Payer: BC Managed Care – PPO | Admitting: *Deleted

## 2020-09-23 DIAGNOSIS — J309 Allergic rhinitis, unspecified: Secondary | ICD-10-CM

## 2020-09-30 ENCOUNTER — Ambulatory Visit (INDEPENDENT_AMBULATORY_CARE_PROVIDER_SITE_OTHER): Payer: BC Managed Care – PPO | Admitting: *Deleted

## 2020-09-30 DIAGNOSIS — J309 Allergic rhinitis, unspecified: Secondary | ICD-10-CM

## 2020-10-07 ENCOUNTER — Ambulatory Visit (INDEPENDENT_AMBULATORY_CARE_PROVIDER_SITE_OTHER): Payer: BC Managed Care – PPO | Admitting: *Deleted

## 2020-10-07 DIAGNOSIS — J309 Allergic rhinitis, unspecified: Secondary | ICD-10-CM | POA: Diagnosis not present

## 2020-10-13 DIAGNOSIS — M8589 Other specified disorders of bone density and structure, multiple sites: Secondary | ICD-10-CM | POA: Insufficient documentation

## 2020-10-13 DIAGNOSIS — M791 Myalgia, unspecified site: Secondary | ICD-10-CM | POA: Insufficient documentation

## 2020-10-16 ENCOUNTER — Ambulatory Visit (INDEPENDENT_AMBULATORY_CARE_PROVIDER_SITE_OTHER): Payer: BC Managed Care – PPO | Admitting: *Deleted

## 2020-10-16 DIAGNOSIS — J309 Allergic rhinitis, unspecified: Secondary | ICD-10-CM | POA: Diagnosis not present

## 2020-10-21 ENCOUNTER — Ambulatory Visit (INDEPENDENT_AMBULATORY_CARE_PROVIDER_SITE_OTHER): Payer: BC Managed Care – PPO | Admitting: *Deleted

## 2020-10-21 DIAGNOSIS — J309 Allergic rhinitis, unspecified: Secondary | ICD-10-CM | POA: Diagnosis not present

## 2020-10-28 ENCOUNTER — Ambulatory Visit (INDEPENDENT_AMBULATORY_CARE_PROVIDER_SITE_OTHER): Payer: BC Managed Care – PPO | Admitting: *Deleted

## 2020-10-28 DIAGNOSIS — J309 Allergic rhinitis, unspecified: Secondary | ICD-10-CM | POA: Diagnosis not present

## 2020-11-04 ENCOUNTER — Ambulatory Visit (INDEPENDENT_AMBULATORY_CARE_PROVIDER_SITE_OTHER): Payer: BC Managed Care – PPO | Admitting: *Deleted

## 2020-11-04 DIAGNOSIS — J309 Allergic rhinitis, unspecified: Secondary | ICD-10-CM

## 2020-11-11 ENCOUNTER — Ambulatory Visit (INDEPENDENT_AMBULATORY_CARE_PROVIDER_SITE_OTHER): Payer: BC Managed Care – PPO | Admitting: *Deleted

## 2020-11-11 DIAGNOSIS — J309 Allergic rhinitis, unspecified: Secondary | ICD-10-CM

## 2020-11-18 ENCOUNTER — Ambulatory Visit (INDEPENDENT_AMBULATORY_CARE_PROVIDER_SITE_OTHER): Payer: BC Managed Care – PPO | Admitting: *Deleted

## 2020-11-18 DIAGNOSIS — J309 Allergic rhinitis, unspecified: Secondary | ICD-10-CM | POA: Diagnosis not present

## 2020-11-27 ENCOUNTER — Ambulatory Visit (INDEPENDENT_AMBULATORY_CARE_PROVIDER_SITE_OTHER): Payer: BC Managed Care – PPO | Admitting: *Deleted

## 2020-11-27 DIAGNOSIS — J309 Allergic rhinitis, unspecified: Secondary | ICD-10-CM | POA: Diagnosis not present

## 2020-12-02 ENCOUNTER — Ambulatory Visit (INDEPENDENT_AMBULATORY_CARE_PROVIDER_SITE_OTHER): Payer: BC Managed Care – PPO | Admitting: *Deleted

## 2020-12-02 DIAGNOSIS — J309 Allergic rhinitis, unspecified: Secondary | ICD-10-CM

## 2020-12-09 ENCOUNTER — Ambulatory Visit (INDEPENDENT_AMBULATORY_CARE_PROVIDER_SITE_OTHER): Payer: BC Managed Care – PPO | Admitting: *Deleted

## 2020-12-09 DIAGNOSIS — J309 Allergic rhinitis, unspecified: Secondary | ICD-10-CM

## 2020-12-18 ENCOUNTER — Ambulatory Visit (INDEPENDENT_AMBULATORY_CARE_PROVIDER_SITE_OTHER): Payer: BC Managed Care – PPO | Admitting: *Deleted

## 2020-12-18 DIAGNOSIS — J309 Allergic rhinitis, unspecified: Secondary | ICD-10-CM

## 2020-12-23 ENCOUNTER — Ambulatory Visit (INDEPENDENT_AMBULATORY_CARE_PROVIDER_SITE_OTHER): Payer: BC Managed Care – PPO | Admitting: *Deleted

## 2020-12-23 DIAGNOSIS — J309 Allergic rhinitis, unspecified: Secondary | ICD-10-CM

## 2020-12-30 ENCOUNTER — Ambulatory Visit: Payer: BC Managed Care – PPO | Admitting: Allergy and Immunology

## 2021-01-08 ENCOUNTER — Ambulatory Visit (INDEPENDENT_AMBULATORY_CARE_PROVIDER_SITE_OTHER): Payer: BC Managed Care – PPO | Admitting: *Deleted

## 2021-01-08 DIAGNOSIS — J309 Allergic rhinitis, unspecified: Secondary | ICD-10-CM | POA: Diagnosis not present

## 2021-01-12 ENCOUNTER — Ambulatory Visit: Payer: BC Managed Care – PPO | Admitting: Allergy

## 2021-01-12 ENCOUNTER — Other Ambulatory Visit: Payer: Self-pay

## 2021-01-12 ENCOUNTER — Encounter: Payer: Self-pay | Admitting: Allergy

## 2021-01-12 VITALS — BP 116/72 | HR 87 | Temp 98.0°F | Resp 16 | Ht 65.0 in | Wt 183.0 lb

## 2021-01-12 DIAGNOSIS — J3089 Other allergic rhinitis: Secondary | ICD-10-CM | POA: Diagnosis not present

## 2021-01-12 DIAGNOSIS — Z789 Other specified health status: Secondary | ICD-10-CM | POA: Insufficient documentation

## 2021-01-12 DIAGNOSIS — H1013 Acute atopic conjunctivitis, bilateral: Secondary | ICD-10-CM | POA: Diagnosis not present

## 2021-01-12 DIAGNOSIS — E119 Type 2 diabetes mellitus without complications: Secondary | ICD-10-CM | POA: Insufficient documentation

## 2021-01-12 MED ORDER — AZELASTINE HCL 0.1 % NA SOLN: 2.0000 | Freq: Two times a day (BID) | NASAL | 5 refills | Status: DC | PRN

## 2021-01-12 NOTE — Progress Notes (Signed)
Follow-up Note  RE: Kim Green MRN: [id number] DOB: 1971/05/29 Date of Office Visit: 01/12/2021   History of present illness: Kim Green is a 50 y.o. female presenting today for follow-up of allergic rhinitis with conjunctivitis.  She was last seen in the office on 12/31/2019 by Dr. Verlin Fester who is no longer with the practice. 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   She does feel like her allergies have been better over the past year.   She may use Xhance several times a month if she has sinus headache.  Xhance causes nose bleeds but she does not use often.  She takes zyrtec daily.  She does report a lot of nasal drainage still at this time.  She has not tried any nasal sprays for drainage control.  She is on allergen immunotherapy at maintenance dosing at this time.  She does come once a week at this time and tolerates her injections well.  She denies any large local or systemic reactions.  She has access to an epinephrine device.   Review of systems: Review of Systems  Constitutional: Negative.   HENT:         See HPI  Eyes: Negative.   Respiratory: Negative.    Cardiovascular: Negative.   Gastrointestinal: Negative.   Musculoskeletal: Negative.   Skin: Negative.   Neurological: Negative.    All other systems negative unless noted above in HPI  Past medical/social/surgical/family history have been reviewed and are unchanged unless specifically indicated below.  No changes  Medication List: Current Outpatient Medications  Medication Sig Dispense Refill   albuterol (VENTOLIN HFA) 108 (90 Base) MCG/ACT inhaler Inhale 2 puffs into the lungs every 4 (four) hours as needed. 18 g 1   ascorbic acid (VITAMIN C) 1000 MG tablet Take by mouth.     atorvastatin (LIPITOR) 10 MG tablet Take 10 mg by mouth daily.     Calcium Carbonate (CALTRATE 600 PO) Take by mouth.     cetirizine (ZYRTEC) 10 MG tablet Take by mouth.      Cholecalciferol (VITAMIN D3) 10 MCG (400 UNIT) tablet Take by mouth.     EPINEPHrine (EPIPEN 2-PAK) 0.3 mg/0.3 mL IJ SOAJ injection Inject 0.3 mg into the muscle as needed for anaphylaxis.     ezetimibe (ZETIA) 10 MG tablet Take 10 mg by mouth daily.     letrozole (FEMARA) 2.5 MG tablet Take 2.5 mg by mouth daily.     NON FORMULARY Allergy injections     venlafaxine XR (EFFEXOR-XR) 37.5 MG 24 hr capsule Take 37.5 mg by mouth daily.     vitamin E 180 MG (400 UNITS) capsule Take by mouth.     vitamin k 100 MCG tablet Take 100 mcg by mouth daily.     Fluticasone Propionate (XHANCE) 93 MCG/ACT EXHU Place 2 sprays into the nose in the morning and at bedtime. (Patient not taking: Reported on 01/12/2021) 32 mL 4   No current facility-administered medications for this visit.     Known medication allergies: Allergies  Allergen Reactions   Avelox [Moxifloxacin Hcl In Nacl] Shortness Of Breath   Atorvastatin Other (See Comments)    Myalgia      Physical examination: Blood pressure 116/72, pulse 87, temperature 98 F (36.7 C), temperature source Tympanic, resp. rate 16, height '5\' 5"'$  (1.651 m), weight 183 lb (83 kg), SpO2 96 %.  General: Alert, interactive, in no acute distress. HEENT: TMs pearly gray, turbinates non-edematous without  discharge, post-pharynx non erythematous. Neck: Supple without lymphadenopathy. Lungs: Clear to auscultation without wheezing, rhonchi or rales. {no increased work of breathing. CV: Normal S1, S2 without murmurs. Abdomen: Nondistended, nontender. Skin: Warm and dry, without lesions or rashes. Extremities:  No clubbing, cyanosis or edema. Neuro:   Grossly intact.  Diagnositics/Labs: None today  Assessment and plan: Perennial and seasonal allergic rhinitis with conjunctivitis Improved Continue appropriate aeroallergen avoidance measures. Use Xhance, 2 actuations per nostril twice a day as needed for severe congestion/sinus pressure For nasal drainage use  Astelin 2 sprays each nostril twice a day as needed Nasal saline spray (i.e., Simply Saline) or nasal saline lavage (i.e., NeilMed) is recommended as needed and prior to medicated nasal sprays. Pataday, 1 drop per eye daily as needed for itchy/watery eyes Continue aeroallergen immunotherapy per protocol.  Doing well and at maintenance dosing  Return in about 1 year or sooner if needed  I appreciate the opportunity to take part in Kim Green's care. Please do not hesitate to contact me with questions.  Sincerely,   Prudy Feeler, MD Allergy/Immunology [facility]

## 2021-01-12 NOTE — Patient Instructions (Signed)
Perennial and seasonal allergic rhinitis with conjunctivitis Improved Continue appropriate aeroallergen avoidance measures. Use Xhance, 2 actuations per nostril twice a day as needed for severe congestion/sinus pressure For nasal drainage use Astelin 2 sprays each nostril twice a day as needed Nasal saline spray (i.e., Simply Saline) or nasal saline lavage (i.e., NeilMed) is recommended as needed and prior to medicated nasal sprays. Pataday, 1 drop per eye daily as needed for itchy/watery eyes Continue aeroallergen immunotherapy per protocol.  Doing well and at maintenance dosing  Return in about 1 year or sooner if needed

## 2021-01-13 ENCOUNTER — Ambulatory Visit (INDEPENDENT_AMBULATORY_CARE_PROVIDER_SITE_OTHER): Payer: BC Managed Care – PPO | Admitting: *Deleted

## 2021-01-13 DIAGNOSIS — J309 Allergic rhinitis, unspecified: Secondary | ICD-10-CM | POA: Diagnosis not present

## 2021-01-15 DIAGNOSIS — J3089 Other allergic rhinitis: Secondary | ICD-10-CM | POA: Diagnosis not present

## 2021-01-19 NOTE — Progress Notes (Signed)
VIALS MADE. EXP 01-19-22

## 2021-01-20 ENCOUNTER — Ambulatory Visit (INDEPENDENT_AMBULATORY_CARE_PROVIDER_SITE_OTHER): Payer: BC Managed Care – PPO

## 2021-01-20 DIAGNOSIS — J309 Allergic rhinitis, unspecified: Secondary | ICD-10-CM

## 2021-01-29 ENCOUNTER — Ambulatory Visit (INDEPENDENT_AMBULATORY_CARE_PROVIDER_SITE_OTHER): Payer: BC Managed Care – PPO | Admitting: *Deleted

## 2021-01-29 DIAGNOSIS — J309 Allergic rhinitis, unspecified: Secondary | ICD-10-CM | POA: Diagnosis not present

## 2021-02-03 ENCOUNTER — Ambulatory Visit (INDEPENDENT_AMBULATORY_CARE_PROVIDER_SITE_OTHER): Payer: BC Managed Care – PPO | Admitting: *Deleted

## 2021-02-03 DIAGNOSIS — J309 Allergic rhinitis, unspecified: Secondary | ICD-10-CM

## 2021-02-12 ENCOUNTER — Ambulatory Visit (INDEPENDENT_AMBULATORY_CARE_PROVIDER_SITE_OTHER): Payer: BC Managed Care – PPO | Admitting: *Deleted

## 2021-02-12 DIAGNOSIS — J309 Allergic rhinitis, unspecified: Secondary | ICD-10-CM | POA: Diagnosis not present

## 2021-02-17 ENCOUNTER — Ambulatory Visit (INDEPENDENT_AMBULATORY_CARE_PROVIDER_SITE_OTHER): Payer: BC Managed Care – PPO | Admitting: *Deleted

## 2021-02-17 DIAGNOSIS — J309 Allergic rhinitis, unspecified: Secondary | ICD-10-CM

## 2021-02-24 ENCOUNTER — Ambulatory Visit (INDEPENDENT_AMBULATORY_CARE_PROVIDER_SITE_OTHER): Payer: BC Managed Care – PPO

## 2021-02-24 DIAGNOSIS — J309 Allergic rhinitis, unspecified: Secondary | ICD-10-CM

## 2021-03-03 ENCOUNTER — Ambulatory Visit (INDEPENDENT_AMBULATORY_CARE_PROVIDER_SITE_OTHER): Payer: BC Managed Care – PPO | Admitting: *Deleted

## 2021-03-03 DIAGNOSIS — J309 Allergic rhinitis, unspecified: Secondary | ICD-10-CM

## 2021-03-17 ENCOUNTER — Ambulatory Visit (INDEPENDENT_AMBULATORY_CARE_PROVIDER_SITE_OTHER): Payer: BC Managed Care – PPO | Admitting: *Deleted

## 2021-03-17 DIAGNOSIS — J309 Allergic rhinitis, unspecified: Secondary | ICD-10-CM

## 2021-03-24 ENCOUNTER — Ambulatory Visit (INDEPENDENT_AMBULATORY_CARE_PROVIDER_SITE_OTHER): Payer: BC Managed Care – PPO | Admitting: *Deleted

## 2021-03-24 DIAGNOSIS — J309 Allergic rhinitis, unspecified: Secondary | ICD-10-CM | POA: Diagnosis not present

## 2021-04-07 ENCOUNTER — Ambulatory Visit (INDEPENDENT_AMBULATORY_CARE_PROVIDER_SITE_OTHER): Payer: BC Managed Care – PPO

## 2021-04-07 DIAGNOSIS — J309 Allergic rhinitis, unspecified: Secondary | ICD-10-CM | POA: Diagnosis not present

## 2021-04-21 ENCOUNTER — Ambulatory Visit (INDEPENDENT_AMBULATORY_CARE_PROVIDER_SITE_OTHER): Payer: BC Managed Care – PPO | Admitting: *Deleted

## 2021-04-21 DIAGNOSIS — J309 Allergic rhinitis, unspecified: Secondary | ICD-10-CM

## 2021-04-21 NOTE — Progress Notes (Signed)
VIALS MADE. EXP 04-21-22

## 2021-04-22 DIAGNOSIS — J3089 Other allergic rhinitis: Secondary | ICD-10-CM | POA: Diagnosis not present

## 2021-05-05 ENCOUNTER — Ambulatory Visit (INDEPENDENT_AMBULATORY_CARE_PROVIDER_SITE_OTHER): Payer: BC Managed Care – PPO | Admitting: *Deleted

## 2021-05-05 DIAGNOSIS — J309 Allergic rhinitis, unspecified: Secondary | ICD-10-CM | POA: Diagnosis not present

## 2021-05-19 ENCOUNTER — Ambulatory Visit (INDEPENDENT_AMBULATORY_CARE_PROVIDER_SITE_OTHER): Payer: BC Managed Care – PPO | Admitting: *Deleted

## 2021-05-19 DIAGNOSIS — J309 Allergic rhinitis, unspecified: Secondary | ICD-10-CM

## 2021-06-04 ENCOUNTER — Ambulatory Visit (INDEPENDENT_AMBULATORY_CARE_PROVIDER_SITE_OTHER): Payer: BC Managed Care – PPO

## 2021-06-04 DIAGNOSIS — J309 Allergic rhinitis, unspecified: Secondary | ICD-10-CM

## 2021-06-25 ENCOUNTER — Ambulatory Visit (INDEPENDENT_AMBULATORY_CARE_PROVIDER_SITE_OTHER): Payer: BC Managed Care – PPO

## 2021-06-25 DIAGNOSIS — J309 Allergic rhinitis, unspecified: Secondary | ICD-10-CM

## 2021-06-30 ENCOUNTER — Ambulatory Visit (INDEPENDENT_AMBULATORY_CARE_PROVIDER_SITE_OTHER): Payer: BC Managed Care – PPO | Admitting: *Deleted

## 2021-06-30 DIAGNOSIS — J309 Allergic rhinitis, unspecified: Secondary | ICD-10-CM

## 2021-07-09 ENCOUNTER — Ambulatory Visit (INDEPENDENT_AMBULATORY_CARE_PROVIDER_SITE_OTHER): Payer: BC Managed Care – PPO | Admitting: *Deleted

## 2021-07-09 DIAGNOSIS — J309 Allergic rhinitis, unspecified: Secondary | ICD-10-CM

## 2021-07-14 ENCOUNTER — Ambulatory Visit (INDEPENDENT_AMBULATORY_CARE_PROVIDER_SITE_OTHER): Payer: BC Managed Care – PPO

## 2021-07-14 DIAGNOSIS — J309 Allergic rhinitis, unspecified: Secondary | ICD-10-CM | POA: Diagnosis not present

## 2021-07-23 ENCOUNTER — Ambulatory Visit (INDEPENDENT_AMBULATORY_CARE_PROVIDER_SITE_OTHER): Payer: BC Managed Care – PPO

## 2021-07-23 DIAGNOSIS — J309 Allergic rhinitis, unspecified: Secondary | ICD-10-CM

## 2021-08-06 ENCOUNTER — Ambulatory Visit (INDEPENDENT_AMBULATORY_CARE_PROVIDER_SITE_OTHER): Payer: BC Managed Care – PPO

## 2021-08-06 DIAGNOSIS — J309 Allergic rhinitis, unspecified: Secondary | ICD-10-CM

## 2021-08-20 ENCOUNTER — Ambulatory Visit (INDEPENDENT_AMBULATORY_CARE_PROVIDER_SITE_OTHER): Payer: BC Managed Care – PPO

## 2021-08-20 DIAGNOSIS — J309 Allergic rhinitis, unspecified: Secondary | ICD-10-CM | POA: Diagnosis not present

## 2021-09-04 ENCOUNTER — Ambulatory Visit (INDEPENDENT_AMBULATORY_CARE_PROVIDER_SITE_OTHER): Payer: BC Managed Care – PPO | Admitting: *Deleted

## 2021-09-04 DIAGNOSIS — J309 Allergic rhinitis, unspecified: Secondary | ICD-10-CM

## 2021-09-14 DIAGNOSIS — J3089 Other allergic rhinitis: Secondary | ICD-10-CM | POA: Diagnosis not present

## 2021-09-14 NOTE — Progress Notes (Signed)
VIALS EXP 09-15-22 ?

## 2021-09-17 ENCOUNTER — Ambulatory Visit (INDEPENDENT_AMBULATORY_CARE_PROVIDER_SITE_OTHER): Payer: BC Managed Care – PPO

## 2021-09-17 DIAGNOSIS — J309 Allergic rhinitis, unspecified: Secondary | ICD-10-CM

## 2021-09-28 ENCOUNTER — Telehealth: Payer: Self-pay

## 2021-09-28 ENCOUNTER — Ambulatory Visit (INDEPENDENT_AMBULATORY_CARE_PROVIDER_SITE_OTHER): Payer: BC Managed Care – PPO

## 2021-09-28 DIAGNOSIS — J309 Allergic rhinitis, unspecified: Secondary | ICD-10-CM | POA: Diagnosis not present

## 2021-09-28 NOTE — Telephone Encounter (Signed)
Patient has been getting injections in her hips for almost a year now. She usually would get them both in her left arm only due to breast cancer. Please advise if she is to continue injection in her hips or if she is to go back to left arm only. Patient has been notified of possible change.  ?

## 2021-09-29 NOTE — Telephone Encounter (Signed)
Thank you she would like to continue with allergy injections in her hips.  ?

## 2021-10-13 ENCOUNTER — Ambulatory Visit (INDEPENDENT_AMBULATORY_CARE_PROVIDER_SITE_OTHER): Payer: BC Managed Care – PPO

## 2021-10-13 DIAGNOSIS — J309 Allergic rhinitis, unspecified: Secondary | ICD-10-CM | POA: Diagnosis not present

## 2021-10-20 ENCOUNTER — Ambulatory Visit (INDEPENDENT_AMBULATORY_CARE_PROVIDER_SITE_OTHER): Payer: BC Managed Care – PPO

## 2021-10-20 DIAGNOSIS — J309 Allergic rhinitis, unspecified: Secondary | ICD-10-CM | POA: Diagnosis not present

## 2021-10-27 ENCOUNTER — Ambulatory Visit (INDEPENDENT_AMBULATORY_CARE_PROVIDER_SITE_OTHER): Payer: BC Managed Care – PPO

## 2021-10-27 DIAGNOSIS — J309 Allergic rhinitis, unspecified: Secondary | ICD-10-CM | POA: Diagnosis not present

## 2021-11-05 ENCOUNTER — Ambulatory Visit (INDEPENDENT_AMBULATORY_CARE_PROVIDER_SITE_OTHER): Payer: BC Managed Care – PPO

## 2021-11-05 DIAGNOSIS — J309 Allergic rhinitis, unspecified: Secondary | ICD-10-CM

## 2021-11-12 ENCOUNTER — Ambulatory Visit (INDEPENDENT_AMBULATORY_CARE_PROVIDER_SITE_OTHER): Payer: BC Managed Care – PPO

## 2021-11-12 DIAGNOSIS — J309 Allergic rhinitis, unspecified: Secondary | ICD-10-CM

## 2021-11-26 ENCOUNTER — Ambulatory Visit (INDEPENDENT_AMBULATORY_CARE_PROVIDER_SITE_OTHER): Payer: BC Managed Care – PPO

## 2021-11-26 DIAGNOSIS — J309 Allergic rhinitis, unspecified: Secondary | ICD-10-CM | POA: Diagnosis not present

## 2021-12-10 ENCOUNTER — Ambulatory Visit (INDEPENDENT_AMBULATORY_CARE_PROVIDER_SITE_OTHER): Payer: BC Managed Care – PPO

## 2021-12-10 DIAGNOSIS — J309 Allergic rhinitis, unspecified: Secondary | ICD-10-CM | POA: Diagnosis not present

## 2021-12-24 ENCOUNTER — Ambulatory Visit (INDEPENDENT_AMBULATORY_CARE_PROVIDER_SITE_OTHER): Payer: BC Managed Care – PPO

## 2021-12-24 DIAGNOSIS — J309 Allergic rhinitis, unspecified: Secondary | ICD-10-CM

## 2022-01-05 ENCOUNTER — Ambulatory Visit (INDEPENDENT_AMBULATORY_CARE_PROVIDER_SITE_OTHER): Payer: BC Managed Care – PPO

## 2022-01-05 DIAGNOSIS — J309 Allergic rhinitis, unspecified: Secondary | ICD-10-CM

## 2022-01-13 ENCOUNTER — Ambulatory Visit: Payer: BC Managed Care – PPO | Admitting: Allergy

## 2022-01-13 ENCOUNTER — Encounter: Payer: Self-pay | Admitting: Allergy

## 2022-01-13 VITALS — BP 114/90 | HR 107 | Temp 98.2°F | Resp 16 | Wt 180.0 lb

## 2022-01-13 DIAGNOSIS — H1013 Acute atopic conjunctivitis, bilateral: Secondary | ICD-10-CM | POA: Diagnosis not present

## 2022-01-13 DIAGNOSIS — J3089 Other allergic rhinitis: Secondary | ICD-10-CM

## 2022-01-13 MED ORDER — IPRATROPIUM BROMIDE 0.06 % NA SOLN: 2.0000 | Freq: Three times a day (TID) | NASAL | 12 refills | Status: AC | PRN

## 2022-01-13 NOTE — Patient Instructions (Addendum)
Perennial and seasonal allergic rhinitis with conjunctivitis Improved but still having some symptoms Continue appropriate aeroallergen avoidance measures. Discussed to nasal spray options: Ryaltris combination nasal spray of mometasone and olopatadine for nasal congestion and drainage control respectively.  Provided with a sample today.  Advised if this sample is effective in controlling her drainage to let us know and we can send in a prescription.  Ryaltris can be used 2 sprays 1-2 times a day as needed nasal Atrovent which is a nasal anticholinergic that helps with nasal drainage control.  I have sent this prescription in for her to use for her nasal drainage control.  Atrovent can be used 2 sprays each nostril up to 3-4 times a day as needed Pataday, 1 drop per eye daily as needed for itchy/watery eyes Continue aeroallergen immunotherapy per protocol.  Doing well and at maintenance dosing If above nasal sprays are not effective in controlling nasal drainage and she continues to have a sensation of reflux related to pill medication use then we will have to consider evaluation for pill esophagitis  Return in about 1 year or sooner if needed

## 2022-01-13 NOTE — Progress Notes (Signed)
Follow-up Note  RE: Kim Green MRN: [id number] DOB: 09-Jun-1971 Date of Office Visit: 01/13/2022   History of present illness: Kim Green is a 51 y.o. female presenting today for follow-up of Allergic rhinitis with conjunctivitis.  She was last seen in the office on 01/12/2021 by myself.  She has had an okay year without any major health changes, surgeries or hospitalizations. She states with her allergies and well-controlled that she is having less headaches but she is still having issues with thick nasal drainage.  She throat clears often.  She states the drainage will then get swallowed and can of get stuck in her throat and she can tell when she goes to take medications as these pills can get stuck in her chest and then feel like she is having bad reflux.  She is on allergen immunotherapy and tolerating this well however she states the injections with the pollens this spring/summer she noticed that it hurts more when it comes in.  She does her best to avoid cats.  She is no longer using Xhance as it was causing nosebleeds.  She also states the Astelin spray did not provide any help with her drainage.  She has not had any itchy or watery eyes to warrant use of an antihistamine based eyedrop.  Review of systems: Review of Systems  Constitutional: Negative.   HENT:         See HPI  Eyes: Negative.   Respiratory: Negative.    Cardiovascular: Negative.   Gastrointestinal: Negative.   Musculoskeletal: Negative.   Skin: Negative.   Allergic/Immunologic: Negative.   Neurological: Negative.      All other systems negative unless noted above in HPI  Past medical/social/surgical/family history have been reviewed and are unchanged unless specifically indicated below.  No changes  Medication List: Current Outpatient Medications  Medication Sig Dispense Refill   albuterol (VENTOLIN HFA) 108 (90 Base) MCG/ACT inhaler Inhale 2 puffs into the lungs every 4 (four) hours as needed. 18  g 1   ascorbic acid (VITAMIN C) 1000 MG tablet Take by mouth.     Calcium Carbonate (CALTRATE 600 PO) Take by mouth.     cetirizine (ZYRTEC) 10 MG tablet Take by mouth.     Cholecalciferol (VITAMIN D3) 10 MCG (400 UNIT) tablet Take by mouth.     EPINEPHrine (EPIPEN 2-PAK) 0.3 mg/0.3 mL IJ SOAJ injection Inject 0.3 mg into the muscle as needed for anaphylaxis.     ipratropium (ATROVENT) 0.06 % nasal spray Place 2 sprays into both nostrils 3 (three) times daily as needed for rhinitis. 15 mL 12   letrozole (FEMARA) 2.5 MG tablet Take 2.5 mg by mouth daily.     Leuprolide Acetate, 3 Month, (LUPRON DEPOT, 57-MONTH, IM) Inject into the muscle.     NON FORMULARY Allergy injections     OZEMPIC, 1 MG/DOSE, 4 MG/3ML SOPN SMARTSIG:0.75 Milliliter(s) SUB-Q Once a Week     venlafaxine XR (EFFEXOR-XR) 37.5 MG 24 hr capsule Take 37.5 mg by mouth daily.     atorvastatin (LIPITOR) 10 MG tablet Take 10 mg by mouth daily. (Patient not taking: Reported on 01/13/2022)     ezetimibe (ZETIA) 10 MG tablet Take 10 mg by mouth daily. (Patient not taking: Reported on 01/13/2022)     Fluticasone Propionate (XHANCE) 93 MCG/ACT EXHU Place 2 sprays into the nose in the morning and at bedtime. (Patient not taking: Reported on 01/13/2022) 32 mL 4   vitamin E 180 MG (400 UNITS) capsule Take  by mouth. (Patient not taking: Reported on 01/13/2022)     vitamin k 100 MCG tablet Take 100 mcg by mouth daily. (Patient not taking: Reported on 01/13/2022)     No current facility-administered medications for this visit.     Known medication allergies: Allergies  Allergen Reactions   Avelox [Moxifloxacin Hcl In Nacl] Shortness Of Breath   Atorvastatin Other (See Comments)    Myalgia      Physical examination: Blood pressure 114/90, pulse (!) 107, temperature 98.2 F (36.8 C), temperature source Temporal, resp. rate 16, weight 180 lb (81.6 kg), SpO2 95 %.  General: Alert, interactive, in no acute distress. HEENT: PERRLA, TMs pearly  gray, turbinates minimally edematous with thick discharge, post-pharynx non erythematous. Neck: Supple without lymphadenopathy. Lungs: Clear to auscultation without wheezing, rhonchi or rales. {no increased work of breathing. CV: Normal S1, S2 without murmurs. Abdomen: Nondistended, nontender. Skin: Warm and dry, without lesions or rashes. Extremities:  No clubbing, cyanosis or edema. Neuro:   Grossly intact.  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 Discussed to nasal spray options: Ryaltris combination nasal spray of mometasone and olopatadine for nasal congestion and drainage control respectively.  Provided with a sample today.  Advised if this sample is effective in controlling her drainage to let us know and we can send in a prescription.  Ryaltris can be used 2 sprays 1-2 times a day as needed nasal Atrovent which is a nasal anticholinergic that helps with nasal drainage control.  I have sent this prescription in for her to use for her nasal drainage control.  Atrovent can be used 2 sprays each nostril up to 3-4 times a day as needed Pataday, 1 drop per eye daily as needed for itchy/watery eyes Continue aeroallergen immunotherapy per protocol.  Doing well and at maintenance dosing If above nasal sprays are not effective in controlling nasal drainage and she continues to have a sensation of reflux related to pill medication use then we will have to consider evaluation for pill esophagitis  Return in about 1 year or sooner if needed  I appreciate the opportunity to take part in Shaniqwa's care. Please do not hesitate to contact me with questions.  Sincerely,   Prudy Feeler, MD Allergy/Immunology [facility]

## 2022-01-18 NOTE — Progress Notes (Signed)
EXP 01/20/23

## 2022-01-19 DIAGNOSIS — J3089 Other allergic rhinitis: Secondary | ICD-10-CM | POA: Diagnosis not present

## 2022-01-21 ENCOUNTER — Ambulatory Visit (INDEPENDENT_AMBULATORY_CARE_PROVIDER_SITE_OTHER): Payer: BC Managed Care – PPO

## 2022-01-21 DIAGNOSIS — J309 Allergic rhinitis, unspecified: Secondary | ICD-10-CM | POA: Diagnosis not present

## 2022-02-02 ENCOUNTER — Ambulatory Visit (INDEPENDENT_AMBULATORY_CARE_PROVIDER_SITE_OTHER): Payer: BC Managed Care – PPO

## 2022-02-02 ENCOUNTER — Telehealth: Payer: Self-pay | Admitting: Allergy

## 2022-02-02 DIAGNOSIS — J309 Allergic rhinitis, unspecified: Secondary | ICD-10-CM | POA: Diagnosis not present

## 2022-02-02 MED ORDER — RYALTRIS 665-25 MCG/ACT NA SUSP: 2.0000 | Freq: Two times a day (BID) | NASAL | 5 refills | Status: AC | PRN

## 2022-02-02 NOTE — Telephone Encounter (Signed)
Informed pt that rylatris only goes thru a speciality pharmacy called [facility] and that they will be calling to ship this medicaton out to her she stated understanding

## 2022-02-02 NOTE — Telephone Encounter (Signed)
Patient called and needs to have the ryaltris nasal spray called into [facility] [address]/ [phone]

## 2022-02-16 ENCOUNTER — Ambulatory Visit (INDEPENDENT_AMBULATORY_CARE_PROVIDER_SITE_OTHER): Payer: BC Managed Care – PPO | Admitting: *Deleted

## 2022-02-16 DIAGNOSIS — J309 Allergic rhinitis, unspecified: Secondary | ICD-10-CM

## 2022-02-25 ENCOUNTER — Ambulatory Visit (INDEPENDENT_AMBULATORY_CARE_PROVIDER_SITE_OTHER): Payer: BC Managed Care – PPO | Admitting: *Deleted

## 2022-02-25 DIAGNOSIS — J309 Allergic rhinitis, unspecified: Secondary | ICD-10-CM | POA: Diagnosis not present

## 2022-03-04 ENCOUNTER — Ambulatory Visit (INDEPENDENT_AMBULATORY_CARE_PROVIDER_SITE_OTHER): Payer: BC Managed Care – PPO | Admitting: *Deleted

## 2022-03-04 DIAGNOSIS — J309 Allergic rhinitis, unspecified: Secondary | ICD-10-CM | POA: Diagnosis not present

## 2022-03-11 ENCOUNTER — Ambulatory Visit (INDEPENDENT_AMBULATORY_CARE_PROVIDER_SITE_OTHER): Payer: BC Managed Care – PPO

## 2022-03-11 DIAGNOSIS — J309 Allergic rhinitis, unspecified: Secondary | ICD-10-CM

## 2022-03-25 ENCOUNTER — Ambulatory Visit (INDEPENDENT_AMBULATORY_CARE_PROVIDER_SITE_OTHER): Payer: BC Managed Care – PPO

## 2022-03-25 DIAGNOSIS — J309 Allergic rhinitis, unspecified: Secondary | ICD-10-CM | POA: Diagnosis not present

## 2022-03-30 ENCOUNTER — Ambulatory Visit (INDEPENDENT_AMBULATORY_CARE_PROVIDER_SITE_OTHER): Payer: BC Managed Care – PPO | Admitting: *Deleted

## 2022-03-30 DIAGNOSIS — J309 Allergic rhinitis, unspecified: Secondary | ICD-10-CM

## 2022-04-22 ENCOUNTER — Ambulatory Visit (INDEPENDENT_AMBULATORY_CARE_PROVIDER_SITE_OTHER): Payer: BC Managed Care – PPO

## 2022-04-22 DIAGNOSIS — J309 Allergic rhinitis, unspecified: Secondary | ICD-10-CM | POA: Diagnosis not present

## 2022-05-11 ENCOUNTER — Ambulatory Visit (INDEPENDENT_AMBULATORY_CARE_PROVIDER_SITE_OTHER): Payer: BC Managed Care – PPO

## 2022-05-11 DIAGNOSIS — J309 Allergic rhinitis, unspecified: Secondary | ICD-10-CM | POA: Diagnosis not present

## 2022-06-03 ENCOUNTER — Ambulatory Visit (INDEPENDENT_AMBULATORY_CARE_PROVIDER_SITE_OTHER): Payer: BC Managed Care – PPO

## 2022-06-03 DIAGNOSIS — J309 Allergic rhinitis, unspecified: Secondary | ICD-10-CM

## 2022-06-16 DIAGNOSIS — J3089 Other allergic rhinitis: Secondary | ICD-10-CM | POA: Diagnosis not present

## 2022-06-16 NOTE — Progress Notes (Signed)
VIALS EXP 06-17-23 

## 2022-06-24 ENCOUNTER — Ambulatory Visit (INDEPENDENT_AMBULATORY_CARE_PROVIDER_SITE_OTHER): Payer: BC Managed Care – PPO

## 2022-06-24 DIAGNOSIS — J309 Allergic rhinitis, unspecified: Secondary | ICD-10-CM

## 2022-07-13 ENCOUNTER — Ambulatory Visit (INDEPENDENT_AMBULATORY_CARE_PROVIDER_SITE_OTHER): Payer: BC Managed Care – PPO

## 2022-07-13 DIAGNOSIS — J309 Allergic rhinitis, unspecified: Secondary | ICD-10-CM | POA: Diagnosis not present

## 2022-08-05 ENCOUNTER — Ambulatory Visit (INDEPENDENT_AMBULATORY_CARE_PROVIDER_SITE_OTHER): Payer: BC Managed Care – PPO | Admitting: *Deleted

## 2022-08-05 DIAGNOSIS — J309 Allergic rhinitis, unspecified: Secondary | ICD-10-CM | POA: Diagnosis not present

## 2022-08-26 ENCOUNTER — Ambulatory Visit (INDEPENDENT_AMBULATORY_CARE_PROVIDER_SITE_OTHER): Payer: BC Managed Care – PPO

## 2022-08-26 DIAGNOSIS — J309 Allergic rhinitis, unspecified: Secondary | ICD-10-CM

## 2022-09-02 ENCOUNTER — Ambulatory Visit (INDEPENDENT_AMBULATORY_CARE_PROVIDER_SITE_OTHER): Payer: BC Managed Care – PPO

## 2022-09-02 DIAGNOSIS — J309 Allergic rhinitis, unspecified: Secondary | ICD-10-CM

## 2022-09-09 ENCOUNTER — Ambulatory Visit (INDEPENDENT_AMBULATORY_CARE_PROVIDER_SITE_OTHER): Payer: BC Managed Care – PPO

## 2022-09-09 DIAGNOSIS — J309 Allergic rhinitis, unspecified: Secondary | ICD-10-CM | POA: Diagnosis not present

## 2022-09-16 ENCOUNTER — Ambulatory Visit (INDEPENDENT_AMBULATORY_CARE_PROVIDER_SITE_OTHER): Payer: BC Managed Care – PPO

## 2022-09-16 DIAGNOSIS — J309 Allergic rhinitis, unspecified: Secondary | ICD-10-CM

## 2022-09-21 ENCOUNTER — Ambulatory Visit (INDEPENDENT_AMBULATORY_CARE_PROVIDER_SITE_OTHER): Payer: BC Managed Care – PPO

## 2022-09-21 DIAGNOSIS — J309 Allergic rhinitis, unspecified: Secondary | ICD-10-CM | POA: Diagnosis not present

## 2022-10-14 ENCOUNTER — Ambulatory Visit (INDEPENDENT_AMBULATORY_CARE_PROVIDER_SITE_OTHER): Payer: BC Managed Care – PPO

## 2022-10-14 DIAGNOSIS — J309 Allergic rhinitis, unspecified: Secondary | ICD-10-CM | POA: Diagnosis not present

## 2022-11-04 ENCOUNTER — Ambulatory Visit (INDEPENDENT_AMBULATORY_CARE_PROVIDER_SITE_OTHER): Payer: BC Managed Care – PPO

## 2022-11-04 DIAGNOSIS — J309 Allergic rhinitis, unspecified: Secondary | ICD-10-CM | POA: Diagnosis not present

## 2022-11-25 ENCOUNTER — Ambulatory Visit (INDEPENDENT_AMBULATORY_CARE_PROVIDER_SITE_OTHER): Payer: BC Managed Care – PPO

## 2022-11-25 DIAGNOSIS — J309 Allergic rhinitis, unspecified: Secondary | ICD-10-CM

## 2022-12-16 ENCOUNTER — Ambulatory Visit (INDEPENDENT_AMBULATORY_CARE_PROVIDER_SITE_OTHER): Payer: BC Managed Care – PPO

## 2022-12-16 DIAGNOSIS — J309 Allergic rhinitis, unspecified: Secondary | ICD-10-CM | POA: Diagnosis not present

## 2023-01-06 ENCOUNTER — Ambulatory Visit (INDEPENDENT_AMBULATORY_CARE_PROVIDER_SITE_OTHER): Payer: BC Managed Care – PPO

## 2023-01-06 DIAGNOSIS — J309 Allergic rhinitis, unspecified: Secondary | ICD-10-CM | POA: Diagnosis not present

## 2023-01-13 DIAGNOSIS — J3089 Other allergic rhinitis: Secondary | ICD-10-CM

## 2023-01-13 NOTE — Progress Notes (Signed)
VIALS EXP 01-13-24

## 2023-02-04 ENCOUNTER — Ambulatory Visit (INDEPENDENT_AMBULATORY_CARE_PROVIDER_SITE_OTHER): Payer: BC Managed Care – PPO

## 2023-02-04 DIAGNOSIS — J309 Allergic rhinitis, unspecified: Secondary | ICD-10-CM

## 2023-02-24 ENCOUNTER — Ambulatory Visit (INDEPENDENT_AMBULATORY_CARE_PROVIDER_SITE_OTHER): Payer: BC Managed Care – PPO

## 2023-02-24 DIAGNOSIS — J309 Allergic rhinitis, unspecified: Secondary | ICD-10-CM

## 2023-03-01 ENCOUNTER — Ambulatory Visit (INDEPENDENT_AMBULATORY_CARE_PROVIDER_SITE_OTHER): Payer: Self-pay

## 2023-03-01 DIAGNOSIS — J309 Allergic rhinitis, unspecified: Secondary | ICD-10-CM | POA: Diagnosis not present

## 2023-03-10 ENCOUNTER — Ambulatory Visit (INDEPENDENT_AMBULATORY_CARE_PROVIDER_SITE_OTHER): Payer: BC Managed Care – PPO

## 2023-03-10 DIAGNOSIS — J309 Allergic rhinitis, unspecified: Secondary | ICD-10-CM | POA: Diagnosis not present

## 2023-03-15 ENCOUNTER — Ambulatory Visit (INDEPENDENT_AMBULATORY_CARE_PROVIDER_SITE_OTHER): Payer: BC Managed Care – PPO

## 2023-03-15 DIAGNOSIS — J309 Allergic rhinitis, unspecified: Secondary | ICD-10-CM

## 2023-04-12 ENCOUNTER — Ambulatory Visit (INDEPENDENT_AMBULATORY_CARE_PROVIDER_SITE_OTHER): Payer: Self-pay | Admitting: *Deleted

## 2023-04-12 DIAGNOSIS — J309 Allergic rhinitis, unspecified: Secondary | ICD-10-CM | POA: Diagnosis not present

## 2023-04-19 ENCOUNTER — Ambulatory Visit (INDEPENDENT_AMBULATORY_CARE_PROVIDER_SITE_OTHER): Payer: Self-pay | Admitting: *Deleted

## 2023-04-19 DIAGNOSIS — J309 Allergic rhinitis, unspecified: Secondary | ICD-10-CM

## 2023-05-17 ENCOUNTER — Ambulatory Visit (INDEPENDENT_AMBULATORY_CARE_PROVIDER_SITE_OTHER): Payer: BC Managed Care – PPO

## 2023-05-17 DIAGNOSIS — J309 Allergic rhinitis, unspecified: Secondary | ICD-10-CM

## 2023-06-23 ENCOUNTER — Ambulatory Visit (INDEPENDENT_AMBULATORY_CARE_PROVIDER_SITE_OTHER): Payer: BC Managed Care – PPO | Admitting: *Deleted

## 2023-06-23 DIAGNOSIS — J309 Allergic rhinitis, unspecified: Secondary | ICD-10-CM

## 2023-07-21 ENCOUNTER — Ambulatory Visit (INDEPENDENT_AMBULATORY_CARE_PROVIDER_SITE_OTHER): Payer: BC Managed Care – PPO | Admitting: *Deleted

## 2023-07-21 DIAGNOSIS — J309 Allergic rhinitis, unspecified: Secondary | ICD-10-CM

## 2023-08-16 ENCOUNTER — Ambulatory Visit (INDEPENDENT_AMBULATORY_CARE_PROVIDER_SITE_OTHER): Payer: Self-pay | Admitting: *Deleted

## 2023-08-16 DIAGNOSIS — J309 Allergic rhinitis, unspecified: Secondary | ICD-10-CM

## 2023-08-25 DIAGNOSIS — J3089 Other allergic rhinitis: Secondary | ICD-10-CM | POA: Diagnosis not present

## 2023-08-25 NOTE — Progress Notes (Signed)
 VIAL 1 MADE 08-25-23. EXP 08-24-24

## 2023-08-26 NOTE — Progress Notes (Signed)
 VIAL 2 MADE 08-26-23. EXP 08-25-24

## 2023-09-13 ENCOUNTER — Ambulatory Visit (INDEPENDENT_AMBULATORY_CARE_PROVIDER_SITE_OTHER): Payer: Self-pay | Admitting: *Deleted

## 2023-09-13 DIAGNOSIS — J309 Allergic rhinitis, unspecified: Secondary | ICD-10-CM | POA: Diagnosis not present

## 2023-10-11 ENCOUNTER — Ambulatory Visit (INDEPENDENT_AMBULATORY_CARE_PROVIDER_SITE_OTHER)

## 2023-10-11 DIAGNOSIS — J309 Allergic rhinitis, unspecified: Secondary | ICD-10-CM | POA: Diagnosis not present

## 2023-11-08 ENCOUNTER — Ambulatory Visit (INDEPENDENT_AMBULATORY_CARE_PROVIDER_SITE_OTHER)

## 2023-11-08 DIAGNOSIS — J309 Allergic rhinitis, unspecified: Secondary | ICD-10-CM

## 2023-11-17 ENCOUNTER — Ambulatory Visit (INDEPENDENT_AMBULATORY_CARE_PROVIDER_SITE_OTHER): Payer: Self-pay

## 2023-11-17 DIAGNOSIS — J309 Allergic rhinitis, unspecified: Secondary | ICD-10-CM

## 2023-11-22 ENCOUNTER — Ambulatory Visit (INDEPENDENT_AMBULATORY_CARE_PROVIDER_SITE_OTHER): Payer: Self-pay

## 2023-11-22 DIAGNOSIS — J309 Allergic rhinitis, unspecified: Secondary | ICD-10-CM | POA: Diagnosis not present

## 2023-12-15 ENCOUNTER — Other Ambulatory Visit: Payer: Self-pay

## 2023-12-15 ENCOUNTER — Ambulatory Visit (INDEPENDENT_AMBULATORY_CARE_PROVIDER_SITE_OTHER): Admitting: Allergy

## 2023-12-15 ENCOUNTER — Encounter: Payer: Self-pay | Admitting: Allergy

## 2023-12-15 VITALS — BP 116/74 | HR 84 | Temp 98.2°F | Ht 64.57 in | Wt 167.4 lb

## 2023-12-15 DIAGNOSIS — H1013 Acute atopic conjunctivitis, bilateral: Secondary | ICD-10-CM | POA: Diagnosis not present

## 2023-12-15 DIAGNOSIS — J309 Allergic rhinitis, unspecified: Secondary | ICD-10-CM

## 2023-12-15 DIAGNOSIS — J3089 Other allergic rhinitis: Secondary | ICD-10-CM

## 2023-12-15 NOTE — Progress Notes (Signed)
 Follow-up Note  RE: Kim Green MRN: [id number] DOB: 11/01/1970 Date of Office Visit: 12/15/2023   History of present illness: Kim Green is a 53 y.o. female presenting today for follow-up of allergic rhinitis with conjunctivitis. She was last seen in the office on 01/13/2022 by myself. Discussed the use of AI scribe software for clinical note transcription with the patient, who gave verbal consent to proceed.  Her allergies have been improving with a significant reduction in sneezing and nasal drainage, although she still experiences some post-nasal drip. Breathing is less affected by pollen, but she continues to experience headaches triggered by certain scents.  She has been receiving allergy  shots since 2021 and is currently on maintenance dosing. She uses nose filters to help with irritants and finds them effective. She was previously prescribed a nasal spray, Ryaltris , but did not continue it due to cost. She has used Xhance  nasal spray in the past as well but currently not using any nasal sprays.  She takes Zyrtec daily. She has missed some doses Zyrtec mostly on weekends and notices a difference after missing a dose or two, experiencing headaches and increased post-nasal drip.  She does not use eye drops.   She is allergic to dogs and experiences less itching when in contact with her dog compared to before.  She used to have to ensure she washed her hands/arms after contact exposure but does not have to do that anymore.  She also gardens and is exposed to allergens outdoors, which affects her symptoms.  No recent surgeries or hospitalizations or major health changes since her last visit.     Review of systems: 10pt ROS negative unless noted above in HPI  Past medical/social/surgical/family history have been reviewed and are unchanged unless specifically indicated below.  No changes  Medication List: Current Outpatient Medications  Medication Sig Dispense Refill    albuterol  (VENTOLIN  HFA) 108 (90 Base) MCG/ACT inhaler Inhale 2 puffs into the lungs every 4 (four) hours as needed. 18 g 1   ascorbic acid (VITAMIN C) 1000 MG tablet Take by mouth.     Calcium Carbonate (CALTRATE 600 PO) Take by mouth.     cetirizine (ZYRTEC) 10 MG tablet Take by mouth.     Cholecalciferol (VITAMIN D3) 10 MCG (400 UNIT) tablet Take by mouth.     EPINEPHrine  (EPIPEN  2-PAK) 0.3 mg/0.3 mL IJ SOAJ injection Inject 0.3 mg into the muscle as needed for anaphylaxis.     atorvastatin (LIPITOR) 10 MG tablet Take 10 mg by mouth daily. (Patient not taking: Reported on 12/15/2023)     ezetimibe (ZETIA) 10 MG tablet Take 10 mg by mouth daily. (Patient not taking: Reported on 12/15/2023)     Fluticasone Propionate  (XHANCE ) 93 MCG/ACT EXHU Place 2 sprays into the nose in the morning and at bedtime. (Patient not taking: Reported on 12/15/2023) 32 mL 4   ipratropium (ATROVENT ) 0.06 % nasal spray Place 2 sprays into both nostrils 3 (three) times daily as needed for rhinitis. (Patient not taking: Reported on 12/15/2023) 15 mL 12   NON FORMULARY Allergy  injections (Patient not taking: Reported on 12/15/2023)     Olopatadine -Mometasone (RYALTRIS ) 665-25 MCG/ACT SUSP Place 2 sprays into both nostrils 2 (two) times daily as needed. (Patient not taking: Reported on 12/15/2023) 29 g 5   No current facility-administered medications for this visit.     Known medication allergies: Allergies  Allergen Reactions   Avelox [Moxifloxacin Hcl In Nacl] Shortness Of Breath   Atorvastatin Other (See  Comments)    Myalgia      Physical examination: Blood pressure 116/74, pulse 84, temperature 98.2 F (36.8 C), temperature source Temporal, height 5' 4.57 (1.64 m), weight 167 lb 6.4 oz (75.9 kg), SpO2 97%.  General: Alert, interactive, in no acute distress. HEENT: PERRLA, TMs pearly gray, turbinates minimally edematous without discharge due to right bleeding deviated septum, post-pharynx non  erythematous. Neck: Supple without lymphadenopathy. Lungs: Clear to auscultation without wheezing, rhonchi or rales. {no increased work of breathing. CV: Normal S1, S2 without murmurs. Abdomen: Nondistended, nontender. Skin: Warm and dry, without lesions or rashes. Extremities:  No clubbing, cyanosis or edema. Neuro:   Grossly intact.  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  Pataday , 1 drop per eye daily as needed for itchy/watery eyes Zyrtec 10mg  daily as needed.  Let's see if you can decrease down to couple times a week dosing.   Continue aeroallergen immunotherapy per protocol.  Doing well and at maintenance dosing.  Discussed you are getting close to completion Schedule skin testing and will see if you have lost your sensitivity to allergens and can discontinue shots or if need a bit more time.    Schedule for skin testing visit Routine follow-up in 1 year or sooner if needed  I appreciate the opportunity to take part in Kim Green's care. Please do not hesitate to contact me with questions.  Sincerely,   Danita Brain, MD Allergy /Immunology [facility]

## 2023-12-15 NOTE — Patient Instructions (Addendum)
 Perennial and seasonal allergic rhinitis with conjunctivitis Improved but still with mild symptoms Continue appropriate aeroallergen avoidance measures.  Pataday , 1 drop per eye daily as needed for itchy/watery eyes Zyrtec 10mg  daily as needed.  Let's see if you can decrease down to couple times a week dosing.   Continue aeroallergen immunotherapy per protocol.  Doing well and at maintenance dosing.  Discussed you are getting close to completion Schedule skin testing and will see if you have lost your sensitivity to allergens and can discontinue shots or if need a bit more time.    Schedule for skin testing visit Routine follow-up in 1 year or sooner if needed

## 2023-12-30 ENCOUNTER — Ambulatory Visit (INDEPENDENT_AMBULATORY_CARE_PROVIDER_SITE_OTHER): Admitting: Allergy

## 2023-12-30 ENCOUNTER — Encounter: Payer: Self-pay | Admitting: Allergy

## 2023-12-30 DIAGNOSIS — H1013 Acute atopic conjunctivitis, bilateral: Secondary | ICD-10-CM | POA: Diagnosis not present

## 2023-12-30 DIAGNOSIS — J3089 Other allergic rhinitis: Secondary | ICD-10-CM

## 2023-12-30 NOTE — Progress Notes (Signed)
 Follow-up Note  RE: Kim Green MRN: [id number] DOB: [birth year] Date of Office Visit: 12/30/2023   History of present illness: Kim Green is a 53 y.o. female presenting today for skin testing visit.  She was last seen in the office on 12/15/23 for allergic rhinitis.  She is in her usual state of health today without recent illness.  She has held antihistamines for at least 3 days for testing today.   Medication List: Current Outpatient Medications  Medication Sig Dispense Refill   albuterol  (VENTOLIN  HFA) 108 (90 Base) MCG/ACT inhaler Inhale 2 puffs into the lungs every 4 (four) hours as needed. 18 g 1   ascorbic acid (VITAMIN C) 1000 MG tablet Take by mouth.     atorvastatin (LIPITOR) 10 MG tablet Take 10 mg by mouth daily. (Patient not taking: Reported on 12/15/2023)     Calcium Carbonate (CALTRATE 600 PO) Take by mouth.     cetirizine (ZYRTEC) 10 MG tablet Take by mouth.     Cholecalciferol (VITAMIN D3) 10 MCG (400 UNIT) tablet Take by mouth.     EPINEPHrine  (EPIPEN  2-PAK) 0.3 mg/0.3 mL IJ SOAJ injection Inject 0.3 mg into the muscle as needed for anaphylaxis.     ezetimibe (ZETIA) 10 MG tablet Take 10 mg by mouth daily. (Patient not taking: Reported on 12/15/2023)     Fluticasone Propionate  (XHANCE ) 93 MCG/ACT EXHU Place 2 sprays into the nose in the morning and at bedtime. (Patient not taking: Reported on 12/15/2023) 32 mL 4   ipratropium (ATROVENT ) 0.06 % nasal spray Place 2 sprays into both nostrils 3 (three) times daily as needed for rhinitis. (Patient not taking: Reported on 12/15/2023) 15 mL 12   NON FORMULARY Allergy  injections (Patient not taking: Reported on 12/15/2023)     Olopatadine -Mometasone (RYALTRIS ) 665-25 MCG/ACT SUSP Place 2 sprays into both nostrils 2 (two) times daily as needed. (Patient not taking: Reported on 12/15/2023) 29 g 5   No current facility-administered medications for this visit.     Known medication allergies: Allergies  Allergen Reactions    Avelox [Moxifloxacin Hcl In Nacl] Shortness Of Breath   Atorvastatin Other (See Comments)    Myalgia    Diagnostics/Labs:  Allergy  testing:   Airborne Adult Perc - 12/30/23 1440     Time Antigen Placed 1440    Allergen Manufacturer Jestine    Location Back    Number of Test 55    1. Control-Buffer 50% Glycerol Negative    2. Control-Histamine 2+    3. Bahia Negative    4. [place] Negative    5. Johnson 2+    6. [place]  Blue Negative    7. Meadow Fescue 2+    8. Perennial Rye 2+    9. Timothy 2+    10. Ragweed Mix Negative    11. Cocklebur Negative    12. Plantain,  English Negative    13. Baccharis Negative    14. Dog Fennel Negative    15. Russian Thistle Negative    16. Lamb's Quarters Negative    17. Sheep Sorrell Negative    18. Rough Pigweed Negative    19. Marsh Elder, Rough Negative    20. Mugwort, Common Negative    21. Box, Elder Negative    22. Cedar, red Negative    23. Sweet Gum Negative    24. Pecan Pollen Negative    25. Pine Mix Negative    26. Walnut, Black Pollen Negative    27. Red Mulberry Negative  28. Ash Mix Negative    29. Birch Mix Negative    30. Beech American Negative    31. Cottonwood, [place] Negative    32. Hickory, White Negative    33. Maple Mix Negative    34. Oak, [place] Mix Negative    35. Sycamore Eastern Negative    36. Alternaria Alternata 2+    37. Cladosporium Herbarum Negative    38. Aspergillus Mix Negative    39. Penicillium Mix Negative    40. Bipolaris Sorokiniana (Helminthosporium) Negative    41. Drechslera Spicifera (Curvularia) Negative    42. Mucor Plumbeus Negative    43. Fusarium Moniliforme Negative    44. Aureobasidium Pullulans (pullulara) Negative    45. Rhizopus Oryzae Negative    46. Botrytis Cinera Negative    47. Epicoccum Nigrum Negative    48. Phoma Betae Negative    49. Dust Mite Mix Negative    50. Cat Hair 10,000 BAU/ml Negative    51.  Dog Epithelia Negative    52. Mixed Feathers  Negative    53. Horse Epithelia Negative    54. Cockroach, German Negative    55. Tobacco Leaf Negative          Allergy  testing results were read and interpreted by provider, documented by clinical staff.   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  Pataday , 1 drop per eye daily as needed for itchy/watery eyes Zyrtec 10mg  daily as needed.  Let's see if you can decrease down to couple times a week dosing.   Continue aeroallergen immunotherapy per protocol.  Doing well and at maintenance dosing.  Discussed you are getting close to completion Schedule skin testing and will see if you have lost your sensitivity to allergens and can discontinue shots or if need a bit more time.   Skin testing today is positive to grass and outdoor mold only at this time.  You have lost reactive to weed pollen, tree pollen, indoor mold, and indoor allergens.  Discussed today since you do note some symptoms still would finish out your current vials and do an extension course with updated vials of grass and mold.   You can let us  know closer to the end of your current vials if you are symptom-free and using allergy  medications as needed then can discontinue completely and not need extension course.     Routine follow-up in 1 year or sooner if needed  I appreciate the opportunity to take part in Kim Green's care. Please do not hesitate to contact me with questions.  Sincerely,   Danita Brain, MD Allergy /Immunology [facility]

## 2023-12-30 NOTE — Patient Instructions (Addendum)
 Perennial and seasonal allergic rhinitis with conjunctivitis Continue appropriate aeroallergen avoidance measures.  Pataday , 1 drop per eye daily as needed for itchy/watery eyes Zyrtec 10mg  daily as needed.  Let's see if you can decrease down to couple times a week dosing.   Continue aeroallergen immunotherapy per protocol.  Doing well and at maintenance dosing.  Discussed you are getting close to completion Schedule skin testing and will see if you have lost your sensitivity to allergens and can discontinue shots or if need a bit more time.   Skin testing today is positive to grass and outdoor mold only at this time.  You have lost reactive to weed pollen, tree pollen, indoor mold, and indoor allergens.  Discussed today since you do note some symptoms still would finish out your current vials and do an extension course with updated vials of grass and mold.   You can let us  know closer to the end of your current vials if you are symptom-free and using allergy  medications as needed then can discontinue completely and not need extension course.     Routine follow-up in 1 year or sooner if needed

## 2024-01-17 ENCOUNTER — Ambulatory Visit (INDEPENDENT_AMBULATORY_CARE_PROVIDER_SITE_OTHER)

## 2024-01-17 DIAGNOSIS — J309 Allergic rhinitis, unspecified: Secondary | ICD-10-CM

## 2024-02-14 ENCOUNTER — Ambulatory Visit (INDEPENDENT_AMBULATORY_CARE_PROVIDER_SITE_OTHER)

## 2024-02-14 DIAGNOSIS — J309 Allergic rhinitis, unspecified: Secondary | ICD-10-CM | POA: Diagnosis not present

## 2024-03-20 ENCOUNTER — Ambulatory Visit

## 2024-03-20 DIAGNOSIS — J309 Allergic rhinitis, unspecified: Secondary | ICD-10-CM | POA: Diagnosis not present

## 2024-04-17 ENCOUNTER — Ambulatory Visit

## 2024-04-17 DIAGNOSIS — J309 Allergic rhinitis, unspecified: Secondary | ICD-10-CM
